# Patient Record
Sex: Female | Born: 1990 | Hispanic: Yes | Smoking: Current every day smoker
Health system: Southern US, Community
[De-identification: ages and names within clinical notes are randomized; demographics above are authoritative.]

## PROBLEM LIST (undated history)

## (undated) ENCOUNTER — Inpatient Hospital Stay (HOSPITAL_COMMUNITY): Payer: Medicaid Other | Admitting: Obstetrics & Gynecology

## (undated) DIAGNOSIS — D649 Anemia, unspecified: Secondary | ICD-10-CM

## (undated) DIAGNOSIS — Z789 Other specified health status: Secondary | ICD-10-CM

## (undated) DIAGNOSIS — A539 Syphilis, unspecified: Secondary | ICD-10-CM

## (undated) HISTORY — DX: Syphilis, unspecified: A53.9

---
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
## 2014-09-29 ENCOUNTER — Encounter (HOSPITAL_BASED_OUTPATIENT_CLINIC_OR_DEPARTMENT_OTHER): Payer: Self-pay | Admitting: Emergency Medicine

## 2014-09-29 ENCOUNTER — Emergency Department (HOSPITAL_BASED_OUTPATIENT_CLINIC_OR_DEPARTMENT_OTHER)
Admission: EM | Admit: 2014-09-29 | Discharge: 2014-09-30 | Disposition: A | Discharge status: Home / Self Care | Payer: Medicaid Other | Attending: Emergency Medicine | Admitting: Emergency Medicine

## 2014-09-29 DIAGNOSIS — A6009 Herpesviral infection of other urogenital tract: Secondary | ICD-10-CM | POA: Insufficient documentation

## 2014-09-29 DIAGNOSIS — A6000 Herpesviral infection of urogenital system, unspecified: Secondary | ICD-10-CM

## 2014-09-29 DIAGNOSIS — Z79899 Other long term (current) drug therapy: Secondary | ICD-10-CM | POA: Insufficient documentation

## 2014-09-29 DIAGNOSIS — Z3202 Encounter for pregnancy test, result negative: Secondary | ICD-10-CM | POA: Insufficient documentation

## 2014-09-29 DIAGNOSIS — K602 Anal fissure, unspecified: Secondary | ICD-10-CM

## 2014-09-29 LAB — URINALYSIS, ROUTINE W REFLEX MICROSCOPIC
Glucose, UA: NEGATIVE mg/dL
Ketones, ur: 15 mg/dL — AB
NITRITE: NEGATIVE
Protein, ur: NEGATIVE mg/dL
Specific Gravity, Urine: 1.029 (ref 1.005–1.030)
UROBILINOGEN UA: 1 mg/dL (ref 0.0–1.0)
pH: 6 (ref 5.0–8.0)

## 2014-09-29 LAB — URINE MICROSCOPIC-ADD ON

## 2014-09-29 LAB — PREGNANCY, URINE: PREG TEST UR: NEGATIVE

## 2014-09-29 MED ORDER — IBUPROFEN 800 MG PO TABS
800.0000 mg | ORAL_TABLET | Freq: Once | ORAL | Status: AC
  Administered 2014-09-29: 800 mg via ORAL
  Filled 2014-09-29: qty 1

## 2014-09-29 MED ORDER — IBUPROFEN 800 MG PO TABS: 800.0000 mg | ORAL_TABLET | Freq: Three times a day (TID) | ORAL | Status: DC

## 2014-09-29 MED ORDER — ACYCLOVIR 200 MG PO CAPS
200.0000 mg | ORAL_CAPSULE | Freq: Once | ORAL | Status: AC
  Administered 2014-09-29: 200 mg via ORAL
  Filled 2014-09-29: qty 1

## 2014-09-29 MED ORDER — ACYCLOVIR 400 MG PO TABS: 400.0000 mg | ORAL_TABLET | Freq: Every day | ORAL | Status: DC

## 2014-09-29 MED ORDER — POLYETHYLENE GLYCOL 3350 17 GM/SCOOP PO POWD: 17.0000 g | Freq: Every day | ORAL | Status: DC

## 2014-09-29 NOTE — ED Notes (Signed)
C/o rectal and vag pain x 3 days w white bumps around vag area and dc,  Rectal pain w irritation

## 2014-09-29 NOTE — Discharge Instructions (Signed)
Genital Herpes °Genital herpes is a sexually transmitted disease. This means that it is a disease passed by having sex with an infected person. There is no cure for genital herpes. The time between attacks can be months to years. The virus may live in a person but produce no problems (symptoms). This infection can be passed to a baby as it travels down the birth canal (vagina). In a newborn, this can cause central nervous system damage, eye damage, or even death. The virus that causes genital herpes is usually HSV-2 virus. The virus that causes oral herpes is usually HSV-1. The diagnosis (learning what is wrong) is made through culture results. °SYMPTOMS  °Usually symptoms of pain and itching begin a few days to a week after contact. It first appears as small blisters that progress to small painful ulcers which then scab over and heal after several days. It affects the outer genitalia, birth canal, cervix, penis, anal area, buttocks, and thighs. °HOME CARE INSTRUCTIONS  °· Keep ulcerated areas dry and clean. °· Take medications as directed. Antiviral medications can speed up healing. They will not prevent recurrences or cure this infection. These medications can also be taken for suppression if there are frequent recurrences. °· While the infection is active, it is contagious. Avoid all sexual contact during active infections. °· Condoms may help prevent spread of the herpes virus. °· Practice safe sex. °· Wash your hands thoroughly after touching the genital area. °· Avoid touching your eyes after touching your genital area. °· Inform your caregiver if you have had genital herpes and become pregnant. It is your responsibility to insure a safe outcome for your baby in this pregnancy. °· Only take over-the-counter or prescription medicines for pain, discomfort, or fever as directed by your caregiver. °SEEK MEDICAL CARE IF:  °· You have a recurrence of this infection. °· You do not respond to medications and are not  improving. °· You have new sources of pain or discharge which have changed from the original infection. °· You have an oral temperature above 102° F (38.9° C). °· You develop abdominal pain. °· You develop eye pain or signs of eye infection. °Document Released: 06/22/2000 Document Revised: 09/17/2011 Document Reviewed: 07/13/2009 °ExitCare® Patient Information ©2015 [employer]. This information is not intended to replace advice given to you by your health care provider. Make sure you discuss any questions you have with your health care provider. ° °

## 2014-09-29 NOTE — ED Provider Notes (Signed)
CSN: [id number]     Arrival date & time 09/29/14  2024 History  This chart was scribed for Azaryah Oleksy, MD by Freida Busmaniana Omoyeni, ED Scribe. This patient was seen in room MH10/MH10 and the patient's care was started 11:16 PM.     Chief Complaint  Patient presents with  . Rectal Pain     Patient is a 24 y.o. female presenting with rash. The history is provided by the patient. No language interpreter was used.  Rash Location:  Ano-genital Ano-genital rash location:  Vulva, perineum and anus Quality: painful   Pain details:    Quality:  Aching   Severity:  Moderate   Onset quality:  Gradual   Timing:  Constant   Progression:  Unchanged Onset quality:  Gradual Timing:  Constant Progression:  Unchanged Chronicity:  New Context: not plant contact   Relieved by:  Nothing Worsened by:  Nothing tried Ineffective treatments:  None tried Associated symptoms: no diarrhea, no fever, no sore throat and no throat swelling   Associated symptoms comment:  Constipation    HPI Comments:  Christina Woodard is a 24 y.o. female who presents to the Emergency Department complaining of swelling and moderate pain to her rectal and vaginal area  for 3 days. She notes pain was preceded by forceful straining while having  a bowel movement about 3 days ago. She also notes she scratched herself when wiping. Pt states pain has progressively worsened since onset and states she has noticed bumps to the area.  No associated symptoms or alleviating factors noted.  Patient gave verbal consent for family to stay during history and physical and discussion of findings.     History reviewed. No pertinent past medical history. Past Surgical History  Procedure Laterality Date  . Cesarean section    . Cesarean section     History reviewed. No pertinent family history. History  Substance Use Topics  . Smoking status: Never Smoker   . Smokeless tobacco: Not on file  . Alcohol Use: No   OB History    No data  available     Review of Systems  Constitutional: Negative for fever and chills.  HENT: Negative for sore throat.   Gastrointestinal: Positive for rectal pain. Negative for diarrhea.  Genitourinary: Positive for vaginal pain.  Skin: Positive for rash.  All other systems reviewed and are negative.     Allergies  Amoxapine and related  Home Medications   Prior to Admission medications   Medication Sig Start Date End Date Taking? Authorizing Provider  diphenhydrAMINE (BENADRYL) 25 mg capsule Take 25 mg by mouth once. Allergic reaction     Historical Provider, MD  diphenhydrAMINE (BENADRYL) 25 MG tablet Take 25 mg by mouth every 6 (six) hours as needed. For itching  02/28/11 03/30/11  Sunnie NielsenBrian Opitz, MD  Etonogestrel (IMPLANON) 68 MG IMPL Inject into the skin once.      Historical Provider, MD  ibuprofen (ADVIL,MOTRIN) 200 MG tablet Take 400 mg by mouth every 6 (six) hours as needed. pain     Historical Provider, MD  Multiple Vitamin (MULTIVITAMIN) tablet Take 1 tablet by mouth daily.      Historical Provider, MD  predniSONE (DELTASONE) 10 MG tablet Take 2 tablets (20 mg total) by mouth daily. 02/28/11 03/10/11  Sunnie NielsenBrian Opitz, MD   BP 108/64 mmHg  Pulse 80  Temp(Src) 98.6 F (37 C) (Oral)  Resp 16  Ht 5\' 4"  (1.626 m)  Wt 141 lb (63.957 kg)  BMI 24.19  kg/m2  SpO2 100% Physical Exam  Constitutional: She is oriented to person, place, and time. She appears well-developed and well-nourished. No distress.  Smells of etoh  HENT:  Head: Normocephalic and atraumatic.  Mouth/Throat: Oropharynx is clear and moist.  Eyes: Conjunctivae are normal. Pupils are equal, round, and reactive to light.  Neck: Normal range of motion. Neck supple.  Cardiovascular: Normal rate, regular rhythm and normal heart sounds.   Pulmonary/Chest: Effort normal and breath sounds normal. No respiratory distress.  Abdominal: Soft. Bowel sounds are normal. She exhibits no distension. There is no tenderness. There is no  rebound.  Genitourinary:  Rectal Exam Chaperone (scribe) was present for exam which was performed with no discomfort or complications.  Vulva and perineum and up to anus herpetic lesions  anal fissure 6 oclock position.      Musculoskeletal: Normal range of motion.  Neurological: She is alert and oriented to person, place, and time.  Skin: Skin is warm and dry.  Psychiatric: She has a normal mood and affect. Her behavior is normal.  Nursing note and vitals reviewed.   ED Course  Procedures   DIAGNOSTIC STUDIES:  Oxygen Saturation is 100% on RA, normal by my interpretation.    COORDINATION OF CARE:  11:20 PM Pt gave permission for sister to stay in room during interview and exam. Discussed treatment plan with pt at bedside and pt agreed to plan.  Labs Review Labs Reviewed  URINALYSIS, ROUTINE W REFLEX MICROSCOPIC - Abnormal; Notable for the following:    APPearance CLOUDY (*)    Hgb urine dipstick SMALL (*)    Bilirubin Urine SMALL (*)    Ketones, ur 15 (*)    Leukocytes, UA SMALL (*)    All other components within normal limits  URINE MICROSCOPIC-ADD ON - Abnormal; Notable for the following:    Squamous Epithelial / LPF FEW (*)    All other components within normal limits  PREGNANCY, URINE    Imaging Review No results found.   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  Acyclovir x 10 days and miralax for constipation and sitz baths and follow up with your GYN.  No sexual activity with active lesions  I personally performed the services described in this documentation, which was scribed in my presence. The recorded information has been reviewed and is accurate.    Cy Blamer, MD 09/30/14 [phone]

## 2014-09-29 NOTE — ED Notes (Signed)
Pt reports that 3 days ago she thought she cut herself while cleaning her self, the next day the area was swollen and she tought she had a hemorrhoid, today area is white and she has bumps on outside of her vagina, reports clear vaginal drainage,

## 2014-09-30 ENCOUNTER — Encounter (HOSPITAL_BASED_OUTPATIENT_CLINIC_OR_DEPARTMENT_OTHER): Payer: Self-pay | Admitting: Emergency Medicine

## 2014-10-01 NOTE — ED Notes (Signed)
tcf patient requesting note to return to work. Informed note could be given showing she was seen 09/29/14 from 2130 to 2350, note left at front desk for patient to pick up

## 2015-08-30 ENCOUNTER — Encounter (HOSPITAL_BASED_OUTPATIENT_CLINIC_OR_DEPARTMENT_OTHER): Payer: Self-pay | Admitting: *Deleted

## 2015-08-30 ENCOUNTER — Emergency Department (HOSPITAL_BASED_OUTPATIENT_CLINIC_OR_DEPARTMENT_OTHER)
Admission: EM | Admit: 2015-08-30 | Discharge: 2015-08-30 | Disposition: A | Discharge status: Home / Self Care | Payer: Medicaid Other | Attending: Emergency Medicine | Admitting: Emergency Medicine

## 2015-08-30 DIAGNOSIS — R55 Syncope and collapse: Secondary | ICD-10-CM | POA: Insufficient documentation

## 2015-08-30 DIAGNOSIS — R51 Headache: Secondary | ICD-10-CM | POA: Insufficient documentation

## 2015-08-30 DIAGNOSIS — Z79899 Other long term (current) drug therapy: Secondary | ICD-10-CM | POA: Insufficient documentation

## 2015-08-30 DIAGNOSIS — N939 Abnormal uterine and vaginal bleeding, unspecified: Secondary | ICD-10-CM | POA: Insufficient documentation

## 2015-08-30 DIAGNOSIS — R42 Dizziness and giddiness: Secondary | ICD-10-CM | POA: Insufficient documentation

## 2015-08-30 DIAGNOSIS — R251 Tremor, unspecified: Secondary | ICD-10-CM | POA: Insufficient documentation

## 2015-08-30 DIAGNOSIS — Z3202 Encounter for pregnancy test, result negative: Secondary | ICD-10-CM | POA: Insufficient documentation

## 2015-08-30 LAB — COMPREHENSIVE METABOLIC PANEL
ALBUMIN: 4.2 g/dL (ref 3.5–5.0)
ALK PHOS: 61 U/L (ref 38–126)
ALT: 14 U/L (ref 14–54)
AST: 18 U/L (ref 15–41)
Anion gap: 9 (ref 5–15)
BILIRUBIN TOTAL: 0.5 mg/dL (ref 0.3–1.2)
BUN: 10 mg/dL (ref 6–20)
CO2: 24 mmol/L (ref 22–32)
Calcium: 9 mg/dL (ref 8.9–10.3)
Chloride: 107 mmol/L (ref 101–111)
Creatinine, Ser: 0.8 mg/dL (ref 0.44–1.00)
GFR calc Af Amer: >60 mL/min (ref >60)
GFR calc non Af Amer: >60 mL/min (ref >60)
GLUCOSE: 87 mg/dL (ref 65–99)
POTASSIUM: 4.2 mmol/L (ref 3.5–5.1)
SODIUM: 140 mmol/L (ref 135–145)
TOTAL PROTEIN: 7 g/dL (ref 6.5–8.1)

## 2015-08-30 LAB — URINE MICROSCOPIC-ADD ON

## 2015-08-30 LAB — URINALYSIS, ROUTINE W REFLEX MICROSCOPIC
Bilirubin Urine: NEGATIVE
Glucose, UA: NEGATIVE mg/dL
KETONES UR: NEGATIVE mg/dL
LEUKOCYTES UA: NEGATIVE
Nitrite: NEGATIVE
PROTEIN: NEGATIVE mg/dL
Specific Gravity, Urine: 1.02 (ref 1.005–1.030)
pH: 6 (ref 5.0–8.0)

## 2015-08-30 LAB — CBC
HEMATOCRIT: 40.9 % (ref 36.0–46.0)
HEMOGLOBIN: 13.4 g/dL (ref 12.0–15.0)
MCH: 31.4 pg (ref 26.0–34.0)
MCHC: 32.8 g/dL (ref 30.0–36.0)
MCV: 95.8 fL (ref 78.0–100.0)
Platelets: 220 10*3/uL (ref 150–400)
RBC: 4.27 MIL/uL (ref 3.87–5.11)
RDW: 12.5 % (ref 11.5–15.5)
WBC: 4.5 10*3/uL (ref 4.0–10.5)

## 2015-08-30 LAB — PREGNANCY, URINE: PREG TEST UR: NEGATIVE

## 2015-08-30 MED ORDER — ACETAMINOPHEN 325 MG PO TABS
650.0000 mg | ORAL_TABLET | Freq: Once | ORAL | Status: AC
  Administered 2015-08-30: 650 mg via ORAL
  Filled 2015-08-30: qty 2

## 2015-08-30 NOTE — ED Provider Notes (Signed)
CSN: [id number]     Arrival date & time 08/30/15  0945 History   First MD Initiated Contact with Patient 08/30/15 [phone]     Chief Complaint  Patient presents with  . Near Syncope     (Consider location/radiation/quality/duration/timing/severity/associated sxs/prior Treatment) HPI Christina Woodard is a 25 y.o. female with no medical problems, presents to emergency department complaining of a syncopal episode. Patient states she woke up this morning feeling well, she states she was in a meeting at work, states she was standing up when suddenly she developed an urge to have a bowel movement. Shortly after that she became dizzy and next thing she remembers is waking up on the floor. Patient apparently fainted and fell forward hitting her head on the ground. Also consciousness for several seconds. Patient denies feeling confused after waking up. She denies any chest pain or palpitations. She states she feels very shaky and jittery. She states she has been under a lot of stress recently and states she has not been sleeping well. She reports just several hours of sleep last night. She did not eat breakfast, but states that is normal. She complains of a headache at this time, mild dizziness which she states is improving, and shaking of the hands. She states she is currently on her menstrual cycle which she started 2 days ago. She is not on any birth control. She is an everyday smoker. She denies any recent drug or alcohol. She denies any chest pain or abdominal pain. No history of syncope  History reviewed. No pertinent past medical history. Past Surgical History  Procedure Laterality Date  . Cesarean section    . Cesarean section     History reviewed. No pertinent family history. Social History  Substance Use Topics  . Smoking status: Never Smoker   . Smokeless tobacco: None  . Alcohol Use: No   OB History    No data available     Review of Systems  Constitutional: Positive for fatigue.  Negative for fever and chills.  Respiratory: Negative for cough, chest tightness and shortness of breath.   Cardiovascular: Negative for chest pain, palpitations and leg swelling.  Gastrointestinal: Negative for nausea, vomiting, abdominal pain and diarrhea.  Genitourinary: Positive for vaginal bleeding. Negative for dysuria, flank pain, vaginal discharge, vaginal pain and pelvic pain.  Musculoskeletal: Negative for myalgias, arthralgias, neck pain and neck stiffness.  Skin: Negative for rash.  Neurological: Positive for dizziness, tremors, syncope, light-headedness and headaches. Negative for seizures.  All other systems reviewed and are negative.     Allergies  Amoxapine and related  Home Medications   Prior to Admission medications   Medication Sig Start Date End Date Taking? Authorizing Provider  diphenhydrAMINE (BENADRYL) 25 MG tablet Take 25 mg by mouth every 6 (six) hours as needed. For itching  02/28/11 03/30/11  Sunnie Nielsen, MD  Multiple Vitamin (MULTIVITAMIN) tablet Take 1 tablet by mouth daily.      Historical Provider, MD   BP 116/84 mmHg  Pulse 88  Temp(Src) 98.1 F (36.7 C) (Oral)  Resp 16  Ht  (1.651 m)  Wt 61.236 kg  BMI 22.47 kg/m2  SpO2 100%  LMP 08/28/2015 Physical Exam  Constitutional: She is oriented to person, place, and time. She appears well-developed and well-nourished. No distress.  HENT:  Head: Normocephalic.  Eyes: Conjunctivae and EOM are normal. Pupils are equal, round, and reactive to light.  Neck: Normal range of motion. Neck supple.  Cardiovascular: Normal rate, regular rhythm  and normal heart sounds.   Pulmonary/Chest: Effort normal and breath sounds normal. No respiratory distress. She has no wheezes. She has no rales.  Abdominal: Soft. Bowel sounds are normal. She exhibits no distension. There is no tenderness. There is no rebound.  Musculoskeletal: She exhibits no edema.  Neurological: She is alert and oriented to person, place, and  time. No cranial nerve deficit.  Skin: Skin is warm and dry.  Psychiatric: She has a normal mood and affect. Her behavior is normal.  Nursing note and vitals reviewed.   ED Course  Procedures (including critical care time) Labs Review Labs Reviewed  URINALYSIS, ROUTINE W REFLEX MICROSCOPIC (NOT AT [facility]) - Abnormal; Notable for the following:    Hgb urine dipstick MODERATE (*)    All other components within normal limits  URINE MICROSCOPIC-ADD ON - Abnormal; Notable for the following:    Squamous Epithelial / LPF 0-5 (*)    Bacteria, UA FEW (*)    All other components within normal limits  PREGNANCY, URINE  CBC  COMPREHENSIVE METABOLIC PANEL    Imaging Review No results found. I have personally reviewed and evaluated these images and lab results as part of my medical decision-making.   EKG Interpretation   Date/Time:  Tuesday August 30 2015 10:29:45 EST Ventricular Rate:  59 PR Interval:  115 QRS Duration: 104 QT Interval:  409 QTC Calculation: 405 R Axis:   -43 Text Interpretation:  Sinus rhythm Borderline short PR interval Left axis  deviation No old tracing to compare Confirmed by BELFI  MD, MELANIE  ([id number]) on 08/30/2015 10:33:04 AM      MDM   Final diagnoses:  Syncope, unspecified syncope type    patient in emergency department after episode of syncope while at work. Patient was standing up and had an urge to have a BM prior to syncope. She is in NAD at this time. VS normal. PERC negative. Will get labs, ECG, fluids   11:31 AM Patient labs unremarkable. She ambulated around the department with no dizziness or any more episode of syncope or near syncope. She admits to a lot of stressors, and believes that she just "overdid it." I think the patient's symptoms may have been vagal. Will discharge home with close outpatient follow-up. Instructed to eat well-balanced meal and drink plain fluids today.  Filed Vitals:   08/30/15 0953  BP: 116/84  Pulse: 88  Temp:  98.1 F (36.7 C)  TempSrc: Oral  Resp: 16  Height:  (1.651 m)  Weight: 61.236 kg  SpO2: 100%     Jaynie Crumble, PA-C 08/30/15 1721  Rolan Bucco, MD 09/01/15 1454

## 2015-08-30 NOTE — Discharge Instructions (Signed)
Make sure to drink plenty of fluids, rest, eat well-balanced meals. Follow-up with a primary care doctor return if worsening symptoms.    Syncope Syncope is a medical term for fainting or passing out. This means you lose consciousness and drop to the ground. People are generally unconscious for less than 5 minutes. You may have some muscle twitches for up to 15 seconds before waking up and returning to normal. Syncope occurs more often in older adults, but it can happen to anyone. While most causes of syncope are not dangerous, syncope can be a sign of a serious medical problem. It is important to seek medical care.  CAUSES  Syncope is caused by a sudden drop in blood flow to the brain. The specific cause is often not determined. Factors that can bring on syncope include:  Taking medicines that lower blood pressure.  Sudden changes in posture, such as standing up quickly.  Taking more medicine than prescribed.  Standing in one place for too long.  Seizure disorders.  Dehydration and excessive exposure to heat.  Low blood sugar (hypoglycemia).  Straining to have a bowel movement.  Heart disease, irregular heartbeat, or other circulatory problems.  Fear, emotional distress, seeing blood, or severe pain. SYMPTOMS  Right before fainting, you may:  Feel dizzy or light-headed.  Feel nauseous.  See all white or all black in your field of vision.  Have cold, clammy skin. DIAGNOSIS  Your health care provider will ask about your symptoms, perform a physical exam, and perform an electrocardiogram (ECG) to record the electrical activity of your heart. Your health care provider may also perform other heart or blood tests to determine the cause of your syncope which may include:  Transthoracic echocardiogram (TTE). During echocardiography, sound waves are used to evaluate how blood flows through your heart.  Transesophageal echocardiogram (TEE).  Cardiac monitoring. This allows your  health care provider to monitor your heart rate and rhythm in real time.  Holter monitor. This is a portable device that records your heartbeat and can help diagnose heart arrhythmias. It allows your health care provider to track your heart activity for several days, if needed.  Stress tests by exercise or by giving medicine that makes the heart beat faster. TREATMENT  In most cases, no treatment is needed. Depending on the cause of your syncope, your health care provider may recommend changing or stopping some of your medicines. HOME CARE INSTRUCTIONS  Have someone stay with you until you feel stable.  Do not drive, use machinery, or play sports until your health care provider says it is okay.  Keep all follow-up appointments as directed by your health care provider.  Lie down right away if you start feeling like you might faint. Breathe deeply and steadily. Wait until all the symptoms have passed.  Drink enough fluids to keep your urine clear or pale yellow.  If you are taking blood pressure or heart medicine, get up slowly and take several minutes to sit and then stand. This can reduce dizziness. SEEK IMMEDIATE MEDICAL CARE IF:   You have a severe headache.  You have unusual pain in the chest, abdomen, or back.  You are bleeding from your mouth or rectum, or you have black or tarry stool.  You have an irregular or very fast heartbeat.  You have pain with breathing.  You have repeated fainting or seizure-like jerking during an episode.  You faint when sitting or lying down.  You have confusion.  You have trouble walking.  You have severe weakness.  You have vision problems. If you fainted, call your local emergency services (911 in [place] Do not drive yourself to the hospital.    This information is not intended to replace advice given to you by your health care provider. Make sure you discuss any questions you have with your health care provider.   Document Released:  06/25/2005 Document Revised: 11/09/2014 Document Reviewed: 08/24/2011 Elsevier Interactive Patient Education 2016 [employer].  Syncope Syncope is a medical term for fainting or passing out. This means you lose consciousness and drop to the ground. People are generally unconscious for less than 5 minutes. You may have some muscle twitches for up to 15 seconds before waking up and returning to normal. Syncope occurs more often in older adults, but it can happen to anyone. While most causes of syncope are not dangerous, syncope can be a sign of a serious medical problem. It is important to seek medical care.  CAUSES  Syncope is caused by a sudden drop in blood flow to the brain. The specific cause is often not determined. Factors that can bring on syncope include:  Taking medicines that lower blood pressure.  Sudden changes in posture, such as standing up quickly.  Taking more medicine than prescribed.  Standing in one place for too long.  Seizure disorders.  Dehydration and excessive exposure to heat.  Low blood sugar (hypoglycemia).  Straining to have a bowel movement.  Heart disease, irregular heartbeat, or other circulatory problems.  Fear, emotional distress, seeing blood, or severe pain. SYMPTOMS  Right before fainting, you may:  Feel dizzy or light-headed.  Feel nauseous.  See all white or all black in your field of vision.  Have cold, clammy skin. DIAGNOSIS  Your health care provider will ask about your symptoms, perform a physical exam, and perform an electrocardiogram (ECG) to record the electrical activity of your heart. Your health care provider may also perform other heart or blood tests to determine the cause of your syncope which may include:  Transthoracic echocardiogram (TTE). During echocardiography, sound waves are used to evaluate how blood flows through your heart.  Transesophageal echocardiogram (TEE).  Cardiac monitoring. This allows your health care  provider to monitor your heart rate and rhythm in real time.  Holter monitor. This is a portable device that records your heartbeat and can help diagnose heart arrhythmias. It allows your health care provider to track your heart activity for several days, if needed.  Stress tests by exercise or by giving medicine that makes the heart beat faster. TREATMENT  In most cases, no treatment is needed. Depending on the cause of your syncope, your health care provider may recommend changing or stopping some of your medicines. HOME CARE INSTRUCTIONS  Have someone stay with you until you feel stable.  Do not drive, use machinery, or play sports until your health care provider says it is okay.  Keep all follow-up appointments as directed by your health care provider.  Lie down right away if you start feeling like you might faint. Breathe deeply and steadily. Wait until all the symptoms have passed.  Drink enough fluids to keep your urine clear or pale yellow.  If you are taking blood pressure or heart medicine, get up slowly and take several minutes to sit and then stand. This can reduce dizziness. SEEK IMMEDIATE MEDICAL CARE IF:   You have a severe headache.  You have unusual pain in the chest, abdomen, or back.  You are bleeding  from your mouth or rectum, or you have black or tarry stool.  You have an irregular or very fast heartbeat.  You have pain with breathing.  You have repeated fainting or seizure-like jerking during an episode.  You faint when sitting or lying down.  You have confusion.  You have trouble walking.  You have severe weakness.  You have vision problems. If you fainted, call your local emergency services (911 in [place] Do not drive yourself to the hospital.    This information is not intended to replace advice given to you by your health care provider. Make sure you discuss any questions you have with your health care provider.   Document Released: 06/25/2005  Document Revised: 11/09/2014 Document Reviewed: 08/24/2011 Elsevier Interactive Patient Education [employer]

## 2015-08-30 NOTE — ED Notes (Signed)
PA at bedside.

## 2015-08-30 NOTE — ED Notes (Signed)
Pt c/o near syncopal episode this am at work, increased stress.

## 2016-06-06 ENCOUNTER — Emergency Department (HOSPITAL_BASED_OUTPATIENT_CLINIC_OR_DEPARTMENT_OTHER)
Admission: EM | Admit: 2016-06-06 | Discharge: 2016-06-06 | Disposition: A | Discharge status: Home / Self Care | Payer: Medicaid Other | Attending: Emergency Medicine | Admitting: Emergency Medicine

## 2016-06-06 ENCOUNTER — Encounter (HOSPITAL_BASED_OUTPATIENT_CLINIC_OR_DEPARTMENT_OTHER): Payer: Self-pay | Admitting: *Deleted

## 2016-06-06 DIAGNOSIS — F17210 Nicotine dependence, cigarettes, uncomplicated: Secondary | ICD-10-CM | POA: Insufficient documentation

## 2016-06-06 DIAGNOSIS — H73011 Bullous myringitis, right ear: Secondary | ICD-10-CM | POA: Insufficient documentation

## 2016-06-06 DIAGNOSIS — N898 Other specified noninflammatory disorders of vagina: Secondary | ICD-10-CM | POA: Insufficient documentation

## 2016-06-06 LAB — URINE MICROSCOPIC-ADD ON

## 2016-06-06 LAB — URINALYSIS, ROUTINE W REFLEX MICROSCOPIC
Bilirubin Urine: NEGATIVE
GLUCOSE, UA: NEGATIVE mg/dL
Ketones, ur: NEGATIVE mg/dL
LEUKOCYTES UA: NEGATIVE
NITRITE: NEGATIVE
PH: 6 (ref 5.0–8.0)
Protein, ur: NEGATIVE mg/dL
SPECIFIC GRAVITY, URINE: 1.027 (ref 1.005–1.030)

## 2016-06-06 LAB — WET PREP, GENITAL
CLUE CELLS WET PREP: NONE SEEN
SPERM: NONE SEEN
Trich, Wet Prep: NONE SEEN
Yeast Wet Prep HPF POC: NONE SEEN

## 2016-06-06 LAB — PREGNANCY, URINE: Preg Test, Ur: NEGATIVE

## 2016-06-06 MED ORDER — AMOXICILLIN-POT CLAVULANATE 875-125 MG PO TABS: 1.0000 | ORAL_TABLET | Freq: Once | ORAL | Status: DC

## 2016-06-06 MED ORDER — AZITHROMYCIN 250 MG PO TABS
500.0000 mg | ORAL_TABLET | Freq: Once | ORAL | Status: AC
  Administered 2016-06-06: 500 mg via ORAL
  Filled 2016-06-06: qty 2

## 2016-06-06 MED ORDER — AMOXICILLIN-POT CLAVULANATE 875-125 MG PO TABS: 1.0000 | ORAL_TABLET | Freq: Two times a day (BID) | ORAL | 0 refills | Status: DC

## 2016-06-06 MED ORDER — AZITHROMYCIN 250 MG PO TABS: 250.0000 mg | ORAL_TABLET | Freq: Every day | ORAL | 0 refills | Status: DC

## 2016-06-06 NOTE — ED Provider Notes (Signed)
Emergency Department Provider Note   I have reviewed the triage vital signs and the nursing notes.   HISTORY  Chief Complaint Vaginal Itching   HPI Christina Woodard is a 25 y.o. female with no significant PMH presents to the emergency department for evaluation of right ear pain and decreased hearing since yesterday. She has associated fever. The patient reports one episode of vaginal discharge which also occurred yesterday. No vaginal bleeding. No vaginal discharge noted today. For the patient's ear pain she got some over-the-counter drops and had some relief in symptoms after using these. This morning she states that she cannot hear out of her right ear. Denies associated headache, neck stiffness, or vision changes. No rashes on the face or neck. She denies discharge from the ear. The patient denies suspicion for exposure to sexual transmitted infections.    History reviewed. No pertinent past medical history.  There are no active problems to display for this patient.   Past Surgical History:  Procedure Laterality Date  . CESAREAN SECTION    . CESAREAN SECTION      Current Outpatient Rx  . Order #: [id number] Class: Print  . Order #: [id number] Class: Print  . Order #: [id number] Class: Historical Med  . Order #: [id number] Class: Historical Med    Allergies Amoxicillin and Amoxapine and related  No family history on file.  Social History Social History  Substance Use Topics  . Smoking status: Current Every Day Smoker    Packs/day: 1.00    Types: Cigarettes  . Smokeless tobacco: Not on file  . Alcohol use No    Review of Systems  Constitutional: No fever/chills Eyes: No visual changes. ENT: Positive mild sore throat. Right ear pain.  Cardiovascular: Denies chest pain. Respiratory: Denies shortness of breath. Gastrointestinal: No abdominal pain.  No nausea, no vomiting.  No diarrhea.  No constipation. Genitourinary: Negative for dysuria. Positive vaginal discharge  yesterday. Musculoskeletal: Negative for back pain. Skin: Negative for rash. Neurological: Negative for headaches, focal weakness or numbness.  10-point ROS otherwise negative.  ____________________________________________   PHYSICAL EXAM:  VITAL SIGNS: ED Triage Vitals  Enc Vitals Group     BP 06/06/16 1403 128/84     Pulse Rate 06/06/16 1403 91     Resp 06/06/16 1403 18     Temp 06/06/16 1403 98.7 F (37.1 C)     Temp Source 06/06/16 1403 Oral     SpO2 06/06/16 1403 100 %     Weight 06/06/16 1404 135 lb (61.2 kg)     Height 06/06/16 1404 5\' 4"  (1.626 m)     Pain Score 06/06/16 1405 8   Constitutional: Alert and oriented. Well appearing and in no acute distress. Eyes: Conjunctivae are normal.  Head: Atraumatic. Ears:  Mild erythema of the right auditory canal. Right TM is erythematous with fluid. Bullae on TM.  Nose: No congestion/rhinnorhea. Mouth/Throat: Mucous membranes are moist.  Oropharynx non-erythematous. Neck: No stridor.   Cardiovascular: Normal rate, regular rhythm. Good peripheral circulation. Grossly normal heart sounds.   Respiratory: Normal respiratory effort.  No retractions. Lungs CTAB. Gastrointestinal: Soft and nontender. No distention. Mild vaginal discharge. No CMT. No adnexal tenderness.  Musculoskeletal: No lower extremity tenderness nor edema. No gross deformities of extremities. Neurologic:  Normal speech and language. No gross focal neurologic deficits are appreciated.  Skin:  Skin is warm, dry and intact. No rash noted.  ____________________________________________   LABS (all labs ordered are listed, but only abnormal results are displayed)  Labs Reviewed  WET PREP, GENITAL - Abnormal; Notable for the following:       Result Value   WBC, Wet Prep HPF POC MANY (*)    All other components within normal limits  URINALYSIS, ROUTINE W REFLEX MICROSCOPIC (NOT AT [facility]) - Abnormal; Notable for the following:    Hgb urine dipstick TRACE (*)     All other components within normal limits  URINE MICROSCOPIC-ADD ON - Abnormal; Notable for the following:    Squamous Epithelial / LPF 0-5 (*)    Bacteria, UA FEW (*)    All other components within normal limits  PREGNANCY, URINE  GC/CHLAMYDIA PROBE AMP ([place]) NOT AT [facility]   ____________________________________________  RADIOLOGY  None ____________________________________________   PROCEDURES  Procedure(s) performed:   Procedures  None ____________________________________________   INITIAL IMPRESSION / ASSESSMENT AND PLAN / ED COURSE  Pertinent labs & imaging results that were available during my care of the patient were reviewed by me and considered in my medical decision making (see chart for details).  Patient resents emergency department for evaluation of right ear pain, hearing loss, vaginal discharge yesterday. She has mild to moderate erythema of her external auditory canal and tympanic membrane. She does have a believes lesion on the right TM with some bulging. No exudative material in the external auditory canal. Otherwise normal ETN exam. Urinalysis is normal. Plan to treat for bullous myringitis. We will also obtain gonorrhea and chlamydia cultures along with wet prep given vaginal discharge yesterday.   04:29 PM Patient with severe allergy to amoxicillin. Given the concern for bullous myringitis I the patient to azithromycin which will cover this organism. Discussed plan at discharge with the patient in detail.  At this time, I do not feel there is any life-threatening condition present. I have reviewed and discussed all results (EKG, imaging, lab, urine as appropriate), exam findings with patient. I have reviewed nursing notes and appropriate previous records.  I feel the patient is safe to be discharged home without further emergent workup. Discussed usual and customary return precautions. Patient and family (if present) verbalize understanding and are  comfortable with this plan.  Patient will follow-up with their primary care provider. If they do not have a primary care provider, information for follow-up has been provided to them. All questions have been answered.  ____________________________________________  FINAL CLINICAL IMPRESSION(S) / ED DIAGNOSES  Final diagnoses:  Bullous myringitis of right ear     MEDICATIONS GIVEN DURING THIS VISIT:  Medications  azithromycin (ZITHROMAX) tablet 500 mg (500 mg Oral Given 06/06/16 1643)     NEW OUTPATIENT MEDICATIONS STARTED DURING THIS VISIT:  Azithromycin    Note:  This document was prepared using Dragon voice recognition software and may include unintentional dictation errors.  Alona BeneJoshua Damilola Flamm, MD Emergency Medicine   Maia PlanJoshua G Aalyssa Elderkin, MD 06/07/16 [phone]

## 2016-06-06 NOTE — Discharge Instructions (Signed)
You were seen in the ED today with an ear infection. We are treating with antibiotics. No clear reason for your vaginal discharge at this time.   Take entire course of antibiotic and return to the ED immediately with any sudden severe headache, weakness, blurry vision, or rash.

## 2016-06-06 NOTE — ED Triage Notes (Signed)
Pt c/o vaginal discharge x 2 days also c/o right ear pain

## 2016-06-06 NOTE — ED Notes (Signed)
Right ear pain since yesterday and vag d/c today.

## 2016-06-07 LAB — GC/CHLAMYDIA PROBE AMP (~~LOC~~) NOT AT ARMC
CHLAMYDIA, DNA PROBE: NEGATIVE
Neisseria Gonorrhea: NEGATIVE

## 2017-12-05 ENCOUNTER — Other Ambulatory Visit: Payer: Self-pay

## 2017-12-05 ENCOUNTER — Emergency Department (HOSPITAL_BASED_OUTPATIENT_CLINIC_OR_DEPARTMENT_OTHER)
Admission: EM | Admit: 2017-12-05 | Discharge: 2017-12-05 | Disposition: A | Discharge status: Home / Self Care | Payer: Self-pay | Attending: Emergency Medicine | Admitting: Emergency Medicine

## 2017-12-05 ENCOUNTER — Emergency Department (HOSPITAL_BASED_OUTPATIENT_CLINIC_OR_DEPARTMENT_OTHER): Payer: Self-pay

## 2017-12-05 ENCOUNTER — Encounter (HOSPITAL_BASED_OUTPATIENT_CLINIC_OR_DEPARTMENT_OTHER): Payer: Self-pay | Admitting: Emergency Medicine

## 2017-12-05 DIAGNOSIS — Z79899 Other long term (current) drug therapy: Secondary | ICD-10-CM | POA: Insufficient documentation

## 2017-12-05 DIAGNOSIS — F17210 Nicotine dependence, cigarettes, uncomplicated: Secondary | ICD-10-CM | POA: Insufficient documentation

## 2017-12-05 DIAGNOSIS — J029 Acute pharyngitis, unspecified: Secondary | ICD-10-CM | POA: Insufficient documentation

## 2017-12-05 DIAGNOSIS — J40 Bronchitis, not specified as acute or chronic: Secondary | ICD-10-CM

## 2017-12-05 LAB — RAPID STREP SCREEN (MED CTR MEBANE ONLY): Streptococcus, Group A Screen (Direct): NEGATIVE

## 2017-12-05 MED ORDER — PREDNISONE 20 MG PO TABS: 40.0000 mg | ORAL_TABLET | Freq: Every day | ORAL | 0 refills | Status: DC

## 2017-12-05 NOTE — ED Triage Notes (Signed)
Sore throat x months

## 2017-12-05 NOTE — ED Provider Notes (Signed)
[facility] EMERGENCY DEPARTMENT Provider Note   CSN: [id number] Arrival date & time: 12/05/17  1149     History   Chief Complaint Chief Complaint  Patient presents with  . Sore Throat    HPI Christina Woodard is a 27 y.o. female.  HPI Christina Woodard is a 27 y.o. female presents to emergency department complaining of sore throat, cough, congestion for 2 months.  She states when her symptoms began she went to urgent care and got an inhaler.  She states she continues to cough with a thick productive sputum.  She reports  she had sore throat earlier but not improved.  Today she looked at her throat in the mirror and noticed some bumps which what is made her come to emergency department.  She denies any fever or chills.  She denies any shortness of breath.  She is a smoker.  No history of asthma.  She states inhaler helps when she uses it.  No other treatment at home prior to coming in.  No sick contacts.  History reviewed. No pertinent past medical history.  There are no active problems to display for this patient.   Past Surgical History:  Procedure Laterality Date  . CESAREAN SECTION    . CESAREAN SECTION       OB History   None      Home Medications    Prior to Admission medications   Medication Sig Start Date End Date Taking? Authorizing Provider  Multiple Vitamin (MULTIVITAMIN) tablet Take 1 tablet by mouth daily.     Yes [provider]  azithromycin (ZITHROMAX) 250 MG tablet Take 1 tablet (250 mg total) by mouth daily. Take first 2 tablets together, then 1 every day until finished. 06/06/16   Long, Arlyss Repress, MD  diphenhydrAMINE (BENADRYL) 25 MG tablet Take 25 mg by mouth every 6 (six) hours as needed. For itching  02/28/11 03/30/11  Sunnie Nielsen, MD    Family History No family history on file.  Social History Social History   Tobacco Use  . Smoking status: Current Every Day Smoker    Packs/day: 1.00    Types: Cigarettes  . Smokeless  tobacco: Never Used  Substance Use Topics  . Alcohol use: No  . Drug use: Yes    Types: Marijuana     Allergies   Amoxicillin and Amoxapine and related   Review of Systems Review of Systems  Constitutional: Negative for chills and fever.  HENT: Positive for congestion and sore throat.   Respiratory: Positive for cough. Negative for chest tightness and shortness of breath.   Cardiovascular: Negative for chest pain, palpitations and leg swelling.  Gastrointestinal: Negative for abdominal pain, diarrhea, nausea and vomiting.  Genitourinary: Negative for dysuria, flank pain, pelvic pain, vaginal bleeding, vaginal discharge and vaginal pain.  Musculoskeletal: Negative for arthralgias, myalgias, neck pain and neck stiffness.  Skin: Negative for rash.  Neurological: Negative for dizziness, weakness and headaches.  All other systems reviewed and are negative.    Physical Exam Updated Vital Signs BP 110/69 (BP Location: Right Arm)   Pulse 71   Temp 98.4 F (36.9 C) (Oral)   Resp 18   Ht  (1.676 m)   Wt 61.2 kg (135 lb)   SpO2 100%   BMI 21.79 kg/m   Physical Exam  Constitutional: She appears well-developed and well-nourished. No distress.  HENT:  Head: Normocephalic.  Right Ear: Tympanic membrane and ear canal normal.  Left Ear: Tympanic membrane  and ear canal normal.  Mouth/Throat: Uvula is midline and oropharynx is clear and moist. No oral lesions. No uvula swelling. No oropharyngeal exudate, posterior oropharyngeal edema, posterior oropharyngeal erythema or tonsillar abscesses.  Eyes: Conjunctivae are normal.  Neck: Neck supple.  Cardiovascular: Normal rate, regular rhythm and normal heart sounds.  Pulmonary/Chest: Effort normal and breath sounds normal. No respiratory distress. She has no wheezes. She has no rales.  Abdominal: Soft. Bowel sounds are normal. She exhibits no distension. There is no tenderness. There is no rebound.  Musculoskeletal: She exhibits no  edema.  Neurological: She is alert.  Skin: Skin is warm and dry.  Psychiatric: She has a normal mood and affect. Her behavior is normal.  Nursing note and vitals reviewed.    ED Treatments / Results  Labs (all labs ordered are listed, but only abnormal results are displayed) Labs Reviewed  RAPID STREP SCREEN ([facility] ONLY)  CULTURE, GROUP A STREP [facility])    EKG None  Radiology Dg Chest 2 View  Result Date: 12/05/2017 CLINICAL DATA:  Cough. EXAM: CHEST - 2 VIEW COMPARISON:  Radiographs of March 17, 2011. FINDINGS: The heart size and mediastinal contours are within normal limits. Both lungs are clear. No pneumothorax or pleural effusion is noted. The visualized skeletal structures are unremarkable. IMPRESSION: No active cardiopulmonary disease. Electronically Signed   By: Lupita Raider, M.D.   On: 12/05/2017 14:23    Procedures Procedures (including critical care time)  Medications Ordered in ED Medications - No data to display   Initial Impression / Assessment and Plan / ED Course  I have reviewed the triage vital signs and the nursing notes.  Pertinent labs & imaging results that were available during my care of the patient were reviewed by me and considered in my medical decision making (see chart for details).     Recent with cough, congestion, sore throat for 2 months.  Rapid strep negative.  Exam unremarkable.  Will get a chest x-ray.  Vital signs all within normal   2:42 PM Chest x-ray negative.  Most likely bronchitis.  Discussed smoking cessation.  Continue inhaler for cough and wheezing, no wheezing on exam at this time.  Prednisone for inflammation.  Follow-up with family doctor.  Return precautions discussed.  Vitals:   12/05/17 1211 12/05/17 1213  BP: 110/69   Pulse: 71   Resp: 18   Temp: 98.4 F (36.9 C)   TempSrc: Oral   SpO2: 100%   Weight:  61.2 kg (135 lb)  Height:   (1.676 m)      Final Clinical Impressions(s) / ED Diagnoses    Final diagnoses:  Bronchitis    ED Discharge Orders    None       Jaynie Crumble, PA-C 12/05/17 1447    Little, Ambrose Finland, MD 12/05/17 1547

## 2017-12-05 NOTE — ED Notes (Signed)
ED Provider at bedside. 

## 2017-12-05 NOTE — Discharge Instructions (Addendum)
Continue over the counter cold and flu medications. Take your inhaler 2 puffs every 4 hrs. Prednisone until all gone. Follow up with family doctor as needed.

## 2017-12-07 LAB — CULTURE, GROUP A STREP (THRC)

## 2019-04-29 ENCOUNTER — Emergency Department (HOSPITAL_BASED_OUTPATIENT_CLINIC_OR_DEPARTMENT_OTHER)
Admission: EM | Admit: 2019-04-29 | Discharge: 2019-04-29 | Disposition: A | Discharge status: Home / Self Care | Payer: Self-pay | Attending: Emergency Medicine | Admitting: Emergency Medicine

## 2019-04-29 ENCOUNTER — Encounter (HOSPITAL_BASED_OUTPATIENT_CLINIC_OR_DEPARTMENT_OTHER): Payer: Self-pay

## 2019-04-29 ENCOUNTER — Other Ambulatory Visit: Payer: Self-pay

## 2019-04-29 DIAGNOSIS — Z114 Encounter for screening for human immunodeficiency virus [HIV]: Secondary | ICD-10-CM | POA: Insufficient documentation

## 2019-04-29 DIAGNOSIS — Z79899 Other long term (current) drug therapy: Secondary | ICD-10-CM | POA: Insufficient documentation

## 2019-04-29 DIAGNOSIS — Z113 Encounter for screening for infections with a predominantly sexual mode of transmission: Secondary | ICD-10-CM | POA: Insufficient documentation

## 2019-04-29 DIAGNOSIS — F17210 Nicotine dependence, cigarettes, uncomplicated: Secondary | ICD-10-CM | POA: Insufficient documentation

## 2019-04-29 DIAGNOSIS — B9689 Other specified bacterial agents as the cause of diseases classified elsewhere: Secondary | ICD-10-CM

## 2019-04-29 DIAGNOSIS — N760 Acute vaginitis: Secondary | ICD-10-CM | POA: Insufficient documentation

## 2019-04-29 LAB — URINALYSIS, ROUTINE W REFLEX MICROSCOPIC
Bilirubin Urine: NEGATIVE
Glucose, UA: NEGATIVE mg/dL
Hgb urine dipstick: NEGATIVE
Ketones, ur: NEGATIVE mg/dL
Leukocytes,Ua: NEGATIVE
Nitrite: NEGATIVE
Protein, ur: NEGATIVE mg/dL
Specific Gravity, Urine: 1.025 (ref 1.005–1.030)
pH: 6 (ref 5.0–8.0)

## 2019-04-29 LAB — PREGNANCY, URINE: Preg Test, Ur: NEGATIVE

## 2019-04-29 LAB — RAPID HIV SCREEN (HIV 1/2 AB+AG)
HIV 1/2 Antibodies: NONREACTIVE
HIV-1 P24 Antigen - HIV24: NONREACTIVE

## 2019-04-29 LAB — WET PREP, GENITAL
Sperm: NONE SEEN
Trich, Wet Prep: NONE SEEN
Yeast Wet Prep HPF POC: NONE SEEN

## 2019-04-29 MED ORDER — LIDOCAINE HCL (PF) 1 % IJ SOLN
INTRAMUSCULAR | Status: AC
  Administered 2019-04-29: 1 mL
  Filled 2019-04-29: qty 5

## 2019-04-29 MED ORDER — METRONIDAZOLE 500 MG PO TABS: 500.0000 mg | ORAL_TABLET | Freq: Two times a day (BID) | ORAL | 0 refills | Status: DC

## 2019-04-29 MED ORDER — VALACYCLOVIR HCL 1 G PO TABS: 1000.0000 mg | ORAL_TABLET | Freq: Three times a day (TID) | ORAL | 0 refills | Status: DC

## 2019-04-29 MED ORDER — AZITHROMYCIN 250 MG PO TABS
1000.0000 mg | ORAL_TABLET | Freq: Once | ORAL | Status: AC
  Administered 2019-04-29: 1000 mg via ORAL
  Filled 2019-04-29: qty 4

## 2019-04-29 MED ORDER — CEFTRIAXONE SODIUM 250 MG IJ SOLR
250.0000 mg | Freq: Once | INTRAMUSCULAR | Status: AC
  Administered 2019-04-29: 250 mg via INTRAMUSCULAR
  Filled 2019-04-29: qty 250

## 2019-04-29 NOTE — ED Notes (Signed)
ED Provider at bedside. 
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
## 2019-04-30 LAB — GC/CHLAMYDIA PROBE AMP (~~LOC~~) NOT AT ARMC
Chlamydia: NEGATIVE
Neisseria Gonorrhea: NEGATIVE

## 2019-04-30 LAB — RPR
RPR Ser Ql: REACTIVE — AB
RPR Titer: 1:1 {titer}

## 2019-05-01 LAB — T.PALLIDUM AB, TOTAL: T Pallidum Abs: REACTIVE — AB

## 2019-06-09 ENCOUNTER — Emergency Department (HOSPITAL_BASED_OUTPATIENT_CLINIC_OR_DEPARTMENT_OTHER): Payer: Medicaid Other

## 2019-06-09 ENCOUNTER — Emergency Department (HOSPITAL_BASED_OUTPATIENT_CLINIC_OR_DEPARTMENT_OTHER)
Admission: EM | Admit: 2019-06-09 | Discharge: 2019-06-09 | Disposition: A | Discharge status: Home / Self Care | Payer: Medicaid Other | Attending: Emergency Medicine | Admitting: Emergency Medicine

## 2019-06-09 ENCOUNTER — Other Ambulatory Visit: Payer: Self-pay

## 2019-06-09 ENCOUNTER — Encounter (HOSPITAL_BASED_OUTPATIENT_CLINIC_OR_DEPARTMENT_OTHER): Payer: Self-pay | Admitting: Emergency Medicine

## 2019-06-09 DIAGNOSIS — Z79899 Other long term (current) drug therapy: Secondary | ICD-10-CM | POA: Insufficient documentation

## 2019-06-09 DIAGNOSIS — Z3A08 8 weeks gestation of pregnancy: Secondary | ICD-10-CM | POA: Insufficient documentation

## 2019-06-09 DIAGNOSIS — F17210 Nicotine dependence, cigarettes, uncomplicated: Secondary | ICD-10-CM | POA: Insufficient documentation

## 2019-06-09 DIAGNOSIS — O208 Other hemorrhage in early pregnancy: Secondary | ICD-10-CM | POA: Diagnosis not present

## 2019-06-09 DIAGNOSIS — R1030 Lower abdominal pain, unspecified: Secondary | ICD-10-CM | POA: Diagnosis not present

## 2019-06-09 DIAGNOSIS — N939 Abnormal uterine and vaginal bleeding, unspecified: Secondary | ICD-10-CM

## 2019-06-09 DIAGNOSIS — O99331 Smoking (tobacco) complicating pregnancy, first trimester: Secondary | ICD-10-CM | POA: Diagnosis not present

## 2019-06-09 DIAGNOSIS — O4690 Antepartum hemorrhage, unspecified, unspecified trimester: Secondary | ICD-10-CM

## 2019-06-09 LAB — URINALYSIS, ROUTINE W REFLEX MICROSCOPIC
Bilirubin Urine: NEGATIVE
Glucose, UA: NEGATIVE mg/dL
Hgb urine dipstick: NEGATIVE
Ketones, ur: NEGATIVE mg/dL
Leukocytes,Ua: NEGATIVE
Nitrite: NEGATIVE
Protein, ur: NEGATIVE mg/dL
Specific Gravity, Urine: 1.02 (ref 1.005–1.030)
pH: 7 (ref 5.0–8.0)

## 2019-06-09 LAB — CBC WITH DIFFERENTIAL/PLATELET
Abs Immature Granulocytes: 0.05 10*3/uL (ref 0.00–0.07)
Basophils Absolute: 0 10*3/uL (ref 0.0–0.1)
Basophils Relative: 1 %
Eosinophils Absolute: 0.1 10*3/uL (ref 0.0–0.5)
Eosinophils Relative: 1 %
HCT: 39.4 % (ref 36.0–46.0)
Hemoglobin: 13.4 g/dL (ref 12.0–15.0)
Immature Granulocytes: 1 %
Lymphocytes Relative: 29 %
Lymphs Abs: 1.7 10*3/uL (ref 0.7–4.0)
MCH: 31.8 pg (ref 26.0–34.0)
MCHC: 34 g/dL (ref 30.0–36.0)
MCV: 93.6 fL (ref 80.0–100.0)
Monocytes Absolute: 0.4 10*3/uL (ref 0.1–1.0)
Monocytes Relative: 7 %
Neutro Abs: 3.5 10*3/uL (ref 1.7–7.7)
Neutrophils Relative %: 61 %
Platelets: 243 10*3/uL (ref 150–400)
RBC: 4.21 MIL/uL (ref 3.87–5.11)
RDW: 11.9 % (ref 11.5–15.5)
WBC: 5.7 10*3/uL (ref 4.0–10.5)
nRBC: 0 % (ref 0.0–0.2)

## 2019-06-09 LAB — WET PREP, GENITAL
Sperm: NONE SEEN
Trich, Wet Prep: NONE SEEN
Yeast Wet Prep HPF POC: NONE SEEN

## 2019-06-09 LAB — RH IG WORKUP (INCLUDES ABO/RH)
ABO/RH(D): O NEG
Antibody Screen: NEGATIVE
Gestational Age(Wks): 12
Weak D: POSITIVE

## 2019-06-09 LAB — COMPREHENSIVE METABOLIC PANEL
ALT: 10 U/L (ref 0–44)
AST: 13 U/L — ABNORMAL LOW (ref 15–41)
Albumin: 3.7 g/dL (ref 3.5–5.0)
Alkaline Phosphatase: 43 U/L (ref 38–126)
Anion gap: 6 (ref 5–15)
BUN: 10 mg/dL (ref 6–20)
CO2: 18 mmol/L — ABNORMAL LOW (ref 22–32)
Calcium: 8.8 mg/dL — ABNORMAL LOW (ref 8.9–10.3)
Chloride: 108 mmol/L (ref 98–111)
Creatinine, Ser: 0.47 mg/dL (ref 0.44–1.00)
GFR calc Af Amer: >60 mL/min (ref >60)
GFR calc non Af Amer: >60 mL/min (ref >60)
Glucose, Bld: 96 mg/dL (ref 70–99)
Potassium: 3.9 mmol/L (ref 3.5–5.1)
Sodium: 132 mmol/L — ABNORMAL LOW (ref 135–145)
Total Bilirubin: 0.4 mg/dL (ref 0.3–1.2)
Total Protein: 6.8 g/dL (ref 6.5–8.1)

## 2019-06-09 LAB — HCG, QUANTITATIVE, PREGNANCY: hCG, Beta Chain, Quant, S: 118849 m[IU]/mL — ABNORMAL HIGH (ref <5)

## 2019-06-09 LAB — PREGNANCY, URINE: Preg Test, Ur: POSITIVE — AB

## 2019-06-09 MED ORDER — PRENATAL COMPLETE 14-0.4 MG PO TABS: 1.0000 | ORAL_TABLET | Freq: Every day | ORAL | 0 refills | Status: DC

## 2019-06-09 NOTE — Discharge Instructions (Signed)
Begin taking prenatal vitamins.  Please follow-up with the OB/GYN as planned.  I recommended that you have the RhoGam injection today, however you have decided to wait until later this week.  Please return to the emergency department or the [facility] emergency department if you develop any new or worsening symptoms.

## 2019-06-09 NOTE — ED Provider Notes (Signed)
[facility] EMERGENCY DEPARTMENT Provider Note   CSN: [id number] Arrival date & time: 06/09/19  1259     History   Chief Complaint Chief Complaint  Patient presents with  . Vaginal Bleeding    HPI Christina Woodard is a 28 y.o. G48P1 female who presents with a 1 week history of vaginal spotting and lower abdominal cramping.  Patient reports she took a pregnancy test last week and was positive.  She gets irregular menstrual cycles and is unsure of the exact last menses.  She reports may be August 2020.  She denies any urinary symptoms, nausea, vomiting, chest pain, shortness of breath, back pain, abnormal vaginal discharge.  She has not yet seen an OB/GYN.  Patient reports she has had at least one miscarriage before, as she had a positive pregnancy test and then passed a bunch of clots and what she describes as products of conception.  She reports this happened twice in the span of 3 months.  She is unsure if she had 2 miscarriages or this was the same one.     HPI  History reviewed. No pertinent past medical history.  There are no active problems to display for this patient.   Past Surgical History:  Procedure Laterality Date  . CESAREAN SECTION    . CESAREAN SECTION       OB History   No obstetric history on file.      Home Medications    Prior to Admission medications   Medication Sig Start Date End Date Taking? Authorizing Provider  azithromycin (ZITHROMAX) 250 MG tablet Take 1 tablet (250 mg total) by mouth daily. Take first 2 tablets together, then 1 every day until finished. 06/06/16   Long, Arlyss Repress, MD  diphenhydrAMINE (BENADRYL) 25 MG tablet Take 25 mg by mouth every 6 (six) hours as needed. For itching  02/28/11 03/30/11  Sunnie Nielsen, MD  metroNIDAZOLE (FLAGYL) 500 MG tablet Take 1 tablet (500 mg total) by mouth 2 (two) times daily. 04/29/19   Raeford Razor, MD  Multiple Vitamin (MULTIVITAMIN) tablet Take 1 tablet by mouth daily.      [provider]  predniSONE (DELTASONE) 20 MG tablet Take 2 tablets (40 mg total) by mouth daily. 12/05/17   Kirichenko, Lemont Fillers, PA-C  Prenatal Vit-Fe Fumarate-FA (PRENATAL COMPLETE) 14-0.4 MG TABS Take 1 tablet by mouth daily. 06/09/19   Haillie Radu, Waylan Boga, PA-C  valACYclovir (VALTREX) 1000 MG tablet Take 1 tablet (1,000 mg total) by mouth 3 (three) times daily. 04/29/19   Raeford Razor, MD    Family History No family history on file.  Social History Social History   Tobacco Use  . Smoking status: Current Every Day Smoker    Packs/day: 1.00    Types: Cigarettes  . Smokeless tobacco: Never Used  Substance Use Topics  . Alcohol use: Yes    Comment: occ  . Drug use: Yes    Types: Marijuana     Allergies   Patient has no known allergies.   Review of Systems Review of Systems  Constitutional: Negative for chills and fever.  HENT: Negative for facial swelling and sore throat.   Respiratory: Negative for shortness of breath.   Cardiovascular: Negative for chest pain.  Gastrointestinal: Positive for abdominal pain. Negative for nausea and vomiting.  Genitourinary: Positive for frequency and vaginal bleeding (spotting). Negative for dysuria and vaginal discharge.  Musculoskeletal: Negative for back pain.  Skin: Negative for rash and wound.  Neurological: Negative for headaches.  Psychiatric/Behavioral: The patient is not nervous/anxious.      Physical Exam Updated Vital Signs BP 108/71   Pulse 65   Temp 98.5 F (36.9 C) (Oral)   Resp 17   Ht 5\' 4"  (1.626 m)   Wt 68.9 kg   SpO2 100%   BMI 26.09 kg/m   Physical Exam Vitals signs and nursing note reviewed. Exam conducted with a chaperone present.  Constitutional:      General: She is not in acute distress.    Appearance: She is well-developed. She is not diaphoretic.  HENT:     Head: Normocephalic and atraumatic.     Mouth/Throat:     Pharynx: No oropharyngeal exudate.  Eyes:     General: No scleral icterus.        Right eye: No discharge.        Left eye: No discharge.     Conjunctiva/sclera: Conjunctivae normal.     Pupils: Pupils are equal, round, and reactive to light.  Neck:     Musculoskeletal: Normal range of motion and neck supple.     Thyroid: No thyromegaly.  Cardiovascular:     Rate and Rhythm: Normal rate and regular rhythm.     Heart sounds: Normal heart sounds. No murmur. No friction rub. No gallop.   Pulmonary:     Effort: Pulmonary effort is normal. No respiratory distress.     Breath sounds: Normal breath sounds. No stridor. No wheezing or rales.  Abdominal:     General: Bowel sounds are normal. There is no distension.     Palpations: Abdomen is soft.     Tenderness: There is abdominal tenderness (mild) in the suprapubic area. There is no guarding or rebound.  Genitourinary:    Vagina: Vaginal discharge (white, milky) present.     Cervix: Discharge (white/yellow) present. No cervical motion tenderness.     Uterus: Not tender.      Adnexa:        Right: No tenderness.         Left: No tenderness.    Lymphadenopathy:     Cervical: No cervical adenopathy.  Skin:    General: Skin is warm and dry.     Coloration: Skin is not pale.     Findings: No rash.  Neurological:     Mental Status: She is alert.     Coordination: Coordination normal.      ED Treatments / Results  Labs (all labs ordered are listed, but only abnormal results are displayed) Labs Reviewed  WET PREP, GENITAL - Abnormal; Notable for the following components:      Result Value   Clue Cells Wet Prep HPF POC PRESENT (*)    WBC, Wet Prep HPF POC MANY (*)    All other components within normal limits  PREGNANCY, URINE - Abnormal; Notable for the following components:   Preg Test, Ur POSITIVE (*)    All other components within normal limits  COMPREHENSIVE METABOLIC PANEL - Abnormal; Notable for the following components:   Sodium 132 (*)    CO2 18 (*)    Calcium 8.8 (*)    AST 13 (*)    All other  components within normal limits  HCG, QUANTITATIVE, PREGNANCY - Abnormal; Notable for the following components:   hCG, Beta Chain, Quant, [place] (*)    All other components within normal limits  URINALYSIS, ROUTINE W REFLEX MICROSCOPIC  CBC WITH DIFFERENTIAL/PLATELET  RH IG WORKUP (INCLUDES ABO/RH)  GC/CHLAMYDIA PROBE AMP ([facility]) NOT AT [facility]    EKG None  Radiology [place] Ob Comp < 14 Wks  Result Date: 06/09/2019 CLINICAL DATA:  Spotting, abdominal pain EXAM: OBSTETRIC <14 WK US AND TRANSVAGINAL OB US TECHNIQUE: Both transabdominal and transvaginal ultrasound examinations were performed for complete evaluation of the gestation as well as the maternal uterus, adnexal regions, and pelvic cul-de-sac. Transvaginal technique was performed to assess early pregnancy. COMPARISON:  None. FINDINGS: Intrauterine gestational sac: Single Yolk sac:  Visualized. Embryo:  Visualized. Cardiac Activity: Visualized. Heart Rate: 168 bpm CRL:  20.7 mm   8 w   5 d                  US EDC: 01/14/2020 Subchorionic hemorrhage:  Small Maternal uterus/adnexae: Question of a left fundal fibroid measuring 3.4 x 2.4 x 4.2 cm. Ovaries are unremarkable, noting corpus luteum on the right. Trace free fluid. IMPRESSION: Single viable intrauterine pregnancy as described. Small subchorionic hemorrhage. Possible left fundal fibroid. Trace free fluid. Electronically Signed   By: Guadlupe SpanishPraneil  Patel M.D.   On: 06/09/2019 15:29   [place] Ob Transvaginal  Result Date: 06/09/2019 CLINICAL DATA:  Spotting, abdominal pain EXAM: OBSTETRIC <14 WK US AND TRANSVAGINAL OB US TECHNIQUE: Both transabdominal and transvaginal ultrasound examinations were performed for complete evaluation of the gestation as well as the maternal uterus, adnexal regions, and pelvic cul-de-sac. Transvaginal technique was performed to assess early pregnancy. COMPARISON:  None. FINDINGS: Intrauterine gestational sac: Single Yolk sac:  Visualized. Embryo:  Visualized. Cardiac  Activity: Visualized. Heart Rate: 168 bpm CRL:  20.7 mm   8 w   5 d                  US EDC: 01/14/2020 Subchorionic hemorrhage:  Small Maternal uterus/adnexae: Question of a left fundal fibroid measuring 3.4 x 2.4 x 4.2 cm. Ovaries are unremarkable, noting corpus luteum on the right. Trace free fluid. IMPRESSION: Single viable intrauterine pregnancy as described. Small subchorionic hemorrhage. Possible left fundal fibroid. Trace free fluid. Electronically Signed   By: Guadlupe SpanishPraneil  Patel M.D.   On: 06/09/2019 15:29    Procedures Procedures (including critical care time)  Medications Ordered in ED Medications - No data to display   Initial Impression / Assessment and Plan / ED Course  I have reviewed the triage vital signs and the nursing notes.  Pertinent labs & imaging results that were available during my care of the patient were reviewed by me and considered in my medical decision making (see chart for details).        Patient presenting with vaginal spotting and abdominal cramping in pregnancy.  Ultrasound shows single viable intrauterine pregnancy at 8 weeks, 5 days.  There is a small subchorionic hemorrhage and possible left fundal fibroid.  Trace free fluid.  UA is negative.  HCG [id number].  Hemoglobin 13.4.  Patient is O- blood type.  RhoGam recommended, however patient does not want to stay and wait for the injection.  She has an OB/GYN appointment later this week.  I advised her of the risk to this.  Patient had no blood on my exam and reports only spotting, thus the risk is probably low.  Will defer treatment of clue cells seen on wet prep to OB/GYN.  Prenatal vitamins initiated.  Smoking cessation discussed.  Patient advised to avoid ibuprofen and only take acetaminophen for pain.  Strict return precautions given.  Patient understands and agrees with plan.  Patient vitals stable throughout ED course and discharged  in satisfactory condition.  Final Clinical Impressions(s) / ED Diagnoses    Final diagnoses:  Vaginal spotting  Vaginal bleeding in pregnancy    ED Discharge Orders         Ordered    Prenatal Vit-Fe Fumarate-FA (PRENATAL COMPLETE) 14-0.4 MG TABS  Daily     06/09/19 1730           Frederica Kuster, PA-C 06/09/19 1741    Maudie Flakes, MD 06/10/19 [phone]

## 2019-06-09 NOTE — ED Triage Notes (Signed)
C/o vaginal bleeding x 1 week with abd cramping. She had a positive pregnancy test at home but has not seen a dr.

## 2019-06-10 LAB — GC/CHLAMYDIA PROBE AMP (~~LOC~~) NOT AT ARMC
Chlamydia: NEGATIVE
Neisseria Gonorrhea: NEGATIVE

## 2019-08-25 ENCOUNTER — Encounter (HOSPITAL_BASED_OUTPATIENT_CLINIC_OR_DEPARTMENT_OTHER): Payer: Self-pay | Admitting: *Deleted

## 2019-08-25 ENCOUNTER — Emergency Department (HOSPITAL_BASED_OUTPATIENT_CLINIC_OR_DEPARTMENT_OTHER): Payer: Medicaid Other

## 2019-08-25 ENCOUNTER — Emergency Department (HOSPITAL_BASED_OUTPATIENT_CLINIC_OR_DEPARTMENT_OTHER)
Admission: EM | Admit: 2019-08-25 | Discharge: 2019-08-25 | Disposition: A | Discharge status: Home / Self Care | Payer: Medicaid Other | Attending: Emergency Medicine | Admitting: Emergency Medicine

## 2019-08-25 ENCOUNTER — Other Ambulatory Visit: Payer: Self-pay

## 2019-08-25 DIAGNOSIS — Z79899 Other long term (current) drug therapy: Secondary | ICD-10-CM | POA: Diagnosis not present

## 2019-08-25 DIAGNOSIS — O99891 Other specified diseases and conditions complicating pregnancy: Secondary | ICD-10-CM | POA: Diagnosis not present

## 2019-08-25 DIAGNOSIS — Z3A20 20 weeks gestation of pregnancy: Secondary | ICD-10-CM | POA: Diagnosis not present

## 2019-08-25 DIAGNOSIS — F17210 Nicotine dependence, cigarettes, uncomplicated: Secondary | ICD-10-CM | POA: Diagnosis not present

## 2019-08-25 DIAGNOSIS — O99332 Smoking (tobacco) complicating pregnancy, second trimester: Secondary | ICD-10-CM | POA: Insufficient documentation

## 2019-08-25 DIAGNOSIS — R519 Headache, unspecified: Secondary | ICD-10-CM | POA: Diagnosis not present

## 2019-08-25 DIAGNOSIS — B9689 Other specified bacterial agents as the cause of diseases classified elsewhere: Secondary | ICD-10-CM

## 2019-08-25 DIAGNOSIS — R1030 Lower abdominal pain, unspecified: Secondary | ICD-10-CM | POA: Diagnosis not present

## 2019-08-25 DIAGNOSIS — Z113 Encounter for screening for infections with a predominantly sexual mode of transmission: Secondary | ICD-10-CM | POA: Insufficient documentation

## 2019-08-25 DIAGNOSIS — N760 Acute vaginitis: Secondary | ICD-10-CM

## 2019-08-25 DIAGNOSIS — O23599 Infection of other part of genital tract in pregnancy, unspecified trimester: Secondary | ICD-10-CM | POA: Insufficient documentation

## 2019-08-25 DIAGNOSIS — R109 Unspecified abdominal pain: Secondary | ICD-10-CM

## 2019-08-25 LAB — WET PREP, GENITAL
Sperm: NONE SEEN
Trich, Wet Prep: NONE SEEN
Yeast Wet Prep HPF POC: NONE SEEN

## 2019-08-25 LAB — CBC WITH DIFFERENTIAL/PLATELET
Abs Immature Granulocytes: 0.09 10*3/uL — ABNORMAL HIGH (ref 0.00–0.07)
Basophils Absolute: 0 10*3/uL (ref 0.0–0.1)
Basophils Relative: 0 %
Eosinophils Absolute: 0.1 10*3/uL (ref 0.0–0.5)
Eosinophils Relative: 2 %
HCT: 37.7 % (ref 36.0–46.0)
Hemoglobin: 12.7 g/dL (ref 12.0–15.0)
Immature Granulocytes: 1 %
Lymphocytes Relative: 29 %
Lymphs Abs: 2 10*3/uL (ref 0.7–4.0)
MCH: 32.5 pg (ref 26.0–34.0)
MCHC: 33.7 g/dL (ref 30.0–36.0)
MCV: 96.4 fL (ref 80.0–100.0)
Monocytes Absolute: 0.5 10*3/uL (ref 0.1–1.0)
Monocytes Relative: 7 %
Neutro Abs: 4.2 10*3/uL (ref 1.7–7.7)
Neutrophils Relative %: 61 %
Platelets: 229 10*3/uL (ref 150–400)
RBC: 3.91 MIL/uL (ref 3.87–5.11)
RDW: 12 % (ref 11.5–15.5)
WBC: 6.9 10*3/uL (ref 4.0–10.5)
nRBC: 0 % (ref 0.0–0.2)

## 2019-08-25 LAB — URINALYSIS, MICROSCOPIC (REFLEX)

## 2019-08-25 LAB — URINALYSIS, ROUTINE W REFLEX MICROSCOPIC
Bilirubin Urine: NEGATIVE
Glucose, UA: NEGATIVE mg/dL
Hgb urine dipstick: NEGATIVE
Ketones, ur: NEGATIVE mg/dL
Nitrite: NEGATIVE
Protein, ur: NEGATIVE mg/dL
Specific Gravity, Urine: 1.015 (ref 1.005–1.030)
pH: 7.5 (ref 5.0–8.0)

## 2019-08-25 LAB — COMPREHENSIVE METABOLIC PANEL
ALT: 10 U/L (ref 0–44)
AST: 13 U/L — ABNORMAL LOW (ref 15–41)
Albumin: 3.4 g/dL — ABNORMAL LOW (ref 3.5–5.0)
Alkaline Phosphatase: 51 U/L (ref 38–126)
Anion gap: 7 (ref 5–15)
BUN: 7 mg/dL (ref 6–20)
CO2: 21 mmol/L — ABNORMAL LOW (ref 22–32)
Calcium: 8.5 mg/dL — ABNORMAL LOW (ref 8.9–10.3)
Chloride: 105 mmol/L (ref 98–111)
Creatinine, Ser: 0.52 mg/dL (ref 0.44–1.00)
GFR calc Af Amer: >60 mL/min (ref >60)
GFR calc non Af Amer: >60 mL/min (ref >60)
Glucose, Bld: 86 mg/dL (ref 70–99)
Potassium: 3.9 mmol/L (ref 3.5–5.1)
Sodium: 133 mmol/L — ABNORMAL LOW (ref 135–145)
Total Bilirubin: 0.4 mg/dL (ref 0.3–1.2)
Total Protein: 6.7 g/dL (ref 6.5–8.1)

## 2019-08-25 LAB — HCG, QUANTITATIVE, PREGNANCY: hCG, Beta Chain, Quant, S: 13534 m[IU]/mL — ABNORMAL HIGH (ref <5)

## 2019-08-25 MED ORDER — PRENATAL COMPLETE 14-0.4 MG PO TABS: ORAL_TABLET | ORAL | 4 refills | Status: DC

## 2019-08-25 MED ORDER — METRONIDAZOLE 500 MG PO TABS: 500.0000 mg | ORAL_TABLET | Freq: Two times a day (BID) | ORAL | 0 refills | Status: DC

## 2019-08-25 MED ORDER — SODIUM CHLORIDE 0.9 % IV BOLUS
1000.0000 mL | Freq: Once | INTRAVENOUS | Status: AC
  Administered 2019-08-25: 1000 mL via INTRAVENOUS

## 2019-08-25 NOTE — ED Provider Notes (Signed)
[facility] EMERGENCY DEPARTMENT Provider Note   CSN: [id number] Arrival date & time: 08/25/19  1808   History Chief Complaint  Patient presents with  . Abdominal Cramping    18 weeks preg    Christina Woodard is a 29 y.o. female G3, P1 approximately [redacted] weeks pregnant presents for evaluation of lower abdominal cramping.  Patient states she has had some cramping throughout her entire pregnancy however was worse today.  Patient also had headache.  She took Tylenol which has significantly relieved her headache and abdominal pain.  States she had vaginal bleeding last week however is not had any over the last week.  She is not currently taking prenatal vitamins.  She is not followed by OB/GYN.  Does smoke tobacco and marijuana. Denies fever, chills, N,V, CP, SOB, vaginal discharge.   History obtained from patient and past medical records. No interpretor was used.  HPI     History reviewed. No pertinent past medical history.  There are no problems to display for this patient.   Past Surgical History:  Procedure Laterality Date  . CESAREAN SECTION    . CESAREAN SECTION       OB History    Gravida  1   Para      Term      Preterm      AB      Living        SAB      TAB      Ectopic      Multiple      Live Births              History reviewed. No pertinent family history.  Social History   Tobacco Use  . Smoking status: Current Every Day Smoker    Packs/day: 1.00    Types: Cigarettes  . Smokeless tobacco: Never Used  Substance Use Topics  . Alcohol use: Yes    Comment: occ  . Drug use: Yes    Types: Marijuana    Home Medications Prior to Admission medications   Medication Sig Start Date End Date Taking? Authorizing Provider  azithromycin (ZITHROMAX) 250 MG tablet Take 1 tablet (250 mg total) by mouth daily. Take first 2 tablets together, then 1 every day until finished. 06/06/16   Long, Arlyss Repress, MD  diphenhydrAMINE (BENADRYL) 25 MG  tablet Take 25 mg by mouth every 6 (six) hours as needed. For itching  02/28/11 03/30/11  Sunnie Nielsen, MD  metroNIDAZOLE (FLAGYL) 500 MG tablet Take 1 tablet (500 mg total) by mouth 2 (two) times daily. 08/25/19   Doyle Tegethoff A, PA-C  Multiple Vitamin (MULTIVITAMIN) tablet Take 1 tablet by mouth daily.      [provider]  predniSONE (DELTASONE) 20 MG tablet Take 2 tablets (40 mg total) by mouth daily. 12/05/17   Kirichenko, Lemont Fillers, PA-C  Prenatal Vit-Fe Fumarate-FA (PRENATAL COMPLETE) 14-0.4 MG TABS Take one tablet daily 08/25/19   Refugia Laneve A, PA-C  valACYclovir (VALTREX) 1000 MG tablet Take 1 tablet (1,000 mg total) by mouth 3 (three) times daily. 04/29/19   Raeford Razor, MD    Allergies    Patient has no known allergies.  Review of Systems   Review of Systems  Constitutional: Negative.   HENT: Negative.   Respiratory: Negative.   Cardiovascular: Negative.   Gastrointestinal: Positive for abdominal pain.  Genitourinary: Negative.   Musculoskeletal: Negative.   Skin: Negative.   Neurological: Positive for headaches. Negative for dizziness, tremors, seizures, syncope,  facial asymmetry, speech difficulty, weakness, light-headedness and numbness.  All other systems reviewed and are negative.  Physical Exam Updated Vital Signs BP 126/89 (BP Location: Left Arm)   Pulse 66   Temp 98.2 F (36.8 C) (Oral)   Resp 18   Ht 5\' 5"  (1.651 m)   Wt 68 kg   LMP 03/11/2019   SpO2 100%   BMI 24.96 kg/m   Physical Exam Vitals and nursing note reviewed. Exam conducted with a chaperone present.  Constitutional:      General: She is not in acute distress.    Appearance: She is well-developed. She is not ill-appearing, toxic-appearing or diaphoretic.  HENT:     Head: Normocephalic and atraumatic.     Nose: Nose normal.     Mouth/Throat:     Mouth: Mucous membranes are moist.     Pharynx: Oropharynx is clear.  Eyes:     Pupils: Pupils are equal, round, and reactive to  light.  Cardiovascular:     Rate and Rhythm: Normal rate.     Pulses: Normal pulses.     Heart sounds: Normal heart sounds.  Pulmonary:     Effort: Pulmonary effort is normal. No respiratory distress.  Abdominal:     General: Bowel sounds are normal. There is no distension.     Palpations: Abdomen is soft.     Tenderness: There is no abdominal tenderness.     Comments: Gravid abdomen to the umbilicus.  FHT 150  Genitourinary:    Comments: Normal appearing external female genitalia without rashes or lesions, normal vaginal epithelium. Normal appearing cervix with white discharge. No cervical petechiae. Cervical os is closed. There is no bleeding noted at the os. No Odor. Bimanual: No CMT, nontender.  No palpable adnexal masses or tenderness. Uterus midline and not fixed. Rectovaginal exam was deferred.  No cystocele or rectocele noted. No pelvic lymphadenopathy noted. Wet prep was obtained.  Cultures for gonorrhea and chlamydia collected. Exam performed with chaperone in room. Musculoskeletal:        General: Normal range of motion.     Cervical back: Normal range of motion.  Skin:    General: Skin is warm and dry.  Neurological:     Mental Status: She is alert.     Comments: Cn 2-12 grossly intact. Negative finger to nose, rhomberg, heel to shin.    ED Results / Procedures / Treatments   Labs (all labs ordered are listed, but only abnormal results are displayed) Labs Reviewed  WET PREP, GENITAL - Abnormal; Notable for the following components:      Result Value   Clue Cells Wet Prep HPF POC PRESENT (*)    WBC, Wet Prep HPF POC MODERATE (*)    All other components within normal limits  HCG, QUANTITATIVE, PREGNANCY - Abnormal; Notable for the following components:   hCG, Beta Chain, Quant, S 13,534 (*)    All other components within normal limits  CBC WITH DIFFERENTIAL/PLATELET - Abnormal; Notable for the following components:   Abs Immature Granulocytes 0.09 (*)    All other  components within normal limits  COMPREHENSIVE METABOLIC PANEL - Abnormal; Notable for the following components:   Sodium 133 (*)    CO2 21 (*)    Calcium 8.5 (*)    Albumin 3.4 (*)    AST 13 (*)    All other components within normal limits  URINALYSIS, ROUTINE W REFLEX MICROSCOPIC - Abnormal; Notable for the following components:   Leukocytes,Ua MODERATE (*)  All other components within normal limits  URINALYSIS, MICROSCOPIC (REFLEX) - Abnormal; Notable for the following components:   Bacteria, UA FEW (*)    All other components within normal limits  RPR  HIV ANTIBODY (ROUTINE TESTING W REFLEX)  GC/CHLAMYDIA PROBE AMP ([place]) NOT AT [facility]    EKG None  Radiology US OB Limited > 14 wks  Result Date: 08/25/2019 CLINICAL DATA:  Lower abdominal pain for several weeks. EXAM: LIMITED OBSTETRIC ULTRASOUND FINDINGS: Number of Fetuses: 1 Heart Rate:  140 bpm Movement: Yes Presentation: Transverse lie with head to maternal left Placental Location: Anterior Previa: No Amniotic Fluid (Subjective):  Within normal limits. BPD: 4.7 cm 20 w  2 d MATERNAL FINDINGS: Cervix:  Appears closed. Uterus/Adnexae: No abnormality visualized. IMPRESSION: Single living, approximately 20 week IUP. No acute maternal findings identified. This exam is performed on an emergent basis and does not comprehensively evaluate fetal size, dating, or anatomy; follow-up complete OB US should be considered if further fetal assessment is warranted. Electronically Signed   By: Danae Orleans M.D.   On: 08/25/2019 19:57    Procedures Procedures (including critical care time)  Medications Ordered in ED Medications  sodium chloride 0.9 % bolus 1,000 mL (0 mLs Intravenous Stopped 08/25/19 2144)    ED Course  I have reviewed the triage vital signs and the nursing notes.  Pertinent labs & imaging results that were available during my care of the patient were reviewed by me and considered in my medical decision making (see  chart for details).  29 year old female G3, P1 approximately [redacted] weeks pregnant presents for evaluation of abdominal pain.  Has had some cramping throughout her entire pregnancy.  She is not followed by OB/GYN.  Vaginal bleeding last week however none currently.  Took Tylenol with significant relief of her pain.  Gravid abdomen to umbilicus.  GU exam with white discharge in vault.  Patient is not dilated or effaced.  Did have a headache this morning however resolved with Tylenol.  She has a nonfocal neuro exam without deficits.  She denies any neurologic symptoms, upper abdominal pain.  She is not hypertensive in the ED.  Labs with elevation in LFTs I low suspicion for preeclampsia.  [place] here in ED shows 20-week single IUP.  Fetal heart tones 150.  Will give patient some fluids.  Will consult OB/GYN.  Consult with Dr. Adrian Blackwater with OB/GYN.  She does not need to be placed on a monitor given she is not viable at this time. Recommends fluids, patient can be DC'd home.  He will have office contact her for close follow-up.  Patient with resolution of abdominal cramping and headache with IV fluids.  Suspect possibly dehydration related, no evidence of preterm labor or eclampsia started on Flagyl for her BV.  Discussed cessation of tobacco marijuana use.  She will be started on prenatal vitamins.  Patient to follow-up outpatient with [facility] any additional new or worsening symptoms  The patient has been appropriately medically screened and/or stabilized in the ED. I have low suspicion for any other emergent medical condition which would require further screening, evaluation or treatment in the ED or require inpatient management.  Patient is hemodynamically stable and in no acute distress.  Patient able to ambulate in department prior to ED.  Evaluation does not show acute pathology that would require ongoing or additional emergent interventions while in the emergency department or further  inpatient treatment.  I have discussed the diagnosis  with the patient and answered all questions.  Pain is been managed while in the emergency department and patient has no further complaints prior to discharge.  Patient is comfortable with plan discussed in room and is stable for discharge at this time.  I have discussed strict return precautions for returning to the emergency department.  Patient was encouraged to follow-up with PCP/specialist refer to at discharge.    MDM Rules/Calculators/A&P                       Final Clinical Impression(s) / ED Diagnoses Final diagnoses:  BV (bacterial vaginosis)  [redacted] weeks gestation of pregnancy  Abdominal cramping    Rx / DC Orders ED Discharge Orders         Ordered    Prenatal Vit-Fe Fumarate-FA (PRENATAL COMPLETE) 14-0.4 MG TABS     08/25/19 2100    metroNIDAZOLE (FLAGYL) 500 MG tablet  2 times daily     08/25/19 2146           Subhan Hoopes A, PA-C 08/25/19 2147    Wyvonnia Dusky, MD 08/25/19 2210

## 2019-08-25 NOTE — Discharge Instructions (Addendum)
Need to follow-up with OB/GYN.  They will call you to schedule an appointment or you may make an appointment with them.  Make sure to drink plenty of fluids.  If you continue to have worsening abdominal pain, vaginal bleeding.  Please seek reevaluation at the maternity admissions unit at [facility] emergency department.  They have OB/GYN's who can assess you at that time.

## 2019-08-25 NOTE — ED Notes (Signed)
Patient transported to Ultrasound 

## 2019-08-25 NOTE — ED Triage Notes (Addendum)
Pt c/o lower abd cramping and back pain , denies vaginal bleeding , pt is 18 weeks preg with no prenatal care

## 2019-08-26 LAB — HIV ANTIBODY (ROUTINE TESTING W REFLEX): HIV Screen 4th Generation wRfx: NONREACTIVE

## 2019-08-27 LAB — GC/CHLAMYDIA PROBE AMP (~~LOC~~) NOT AT ARMC
Chlamydia: NEGATIVE
Neisseria Gonorrhea: NEGATIVE

## 2019-08-27 LAB — RPR: RPR Ser Ql: NONREACTIVE

## 2019-09-11 ENCOUNTER — Ambulatory Visit (INDEPENDENT_AMBULATORY_CARE_PROVIDER_SITE_OTHER): Payer: Medicaid Other | Admitting: Family Medicine

## 2019-09-11 ENCOUNTER — Encounter: Payer: Self-pay | Admitting: Family Medicine

## 2019-09-11 ENCOUNTER — Other Ambulatory Visit: Payer: Self-pay

## 2019-09-11 ENCOUNTER — Other Ambulatory Visit (HOSPITAL_COMMUNITY)
Admission: RE | Admit: 2019-09-11 | Discharge: 2019-09-11 | Disposition: A | Discharge status: Home / Self Care | Payer: Medicaid Other | Source: Ambulatory Visit | Attending: Family Medicine | Admitting: Family Medicine

## 2019-09-11 VITALS — BP 111/77 | HR 91 | Wt 149.0 lb

## 2019-09-11 DIAGNOSIS — O34219 Maternal care for unspecified type scar from previous cesarean delivery: Secondary | ICD-10-CM | POA: Diagnosis not present

## 2019-09-11 DIAGNOSIS — Z3A22 22 weeks gestation of pregnancy: Secondary | ICD-10-CM | POA: Diagnosis not present

## 2019-09-11 DIAGNOSIS — Z98891 History of uterine scar from previous surgery: Secondary | ICD-10-CM | POA: Insufficient documentation

## 2019-09-11 DIAGNOSIS — Z3480 Encounter for supervision of other normal pregnancy, unspecified trimester: Secondary | ICD-10-CM | POA: Insufficient documentation

## 2019-09-11 DIAGNOSIS — Z8619 Personal history of other infectious and parasitic diseases: Secondary | ICD-10-CM

## 2019-09-11 NOTE — Patient Instructions (Signed)
Levonorgestrel intrauterine device (IUD) What is this medicine? LEVONORGESTREL IUD (LEE voe nor jes trel) is a contraceptive (birth control) device. The device is placed inside the uterus by a healthcare professional. It is used to prevent pregnancy. This device can also be used to treat heavy bleeding that occurs during your period. This medicine may be used for other purposes; ask your health care provider or pharmacist if you have questions. COMMON BRAND NAME(S): Kyleena, LILETTA, Mirena, Skyla What should I tell my health care provider before I take this medicine? They need to know if you have any of these conditions:  abnormal Pap smear  cancer of the breast, uterus, or cervix  diabetes  endometritis  genital or pelvic infection now or in the past  have more than one sexual partner or your partner has more than one partner  heart disease  history of an ectopic or tubal pregnancy  immune system problems  IUD in place  liver disease or tumor  problems with blood clots or take blood-thinners  seizures  use intravenous drugs  uterus of unusual shape  vaginal bleeding that has not been explained  an unusual or allergic reaction to levonorgestrel, other hormones, silicone, or polyethylene, medicines, foods, dyes, or preservatives  pregnant or trying to get pregnant  breast-feeding How should I use this medicine? This device is placed inside the uterus by a health care professional. Talk to your pediatrician regarding the use of this medicine in children. Special care may be needed. Overdosage: If you think you have taken too much of this medicine contact a poison control center or emergency room at once. NOTE: This medicine is only for you. Do not share this medicine with others. What if I miss a dose? This does not apply. Depending on the brand of device you have inserted, the device will need to be replaced every 3 to 6 years if you wish to continue using this type  of birth control. What may interact with this medicine? Do not take this medicine with any of the following medications:  amprenavir  bosentan  fosamprenavir This medicine may also interact with the following medications:  aprepitant  armodafinil  barbiturate medicines for inducing sleep or treating seizures  bexarotene  boceprevir  griseofulvin  medicines to treat seizures like carbamazepine, ethotoin, felbamate, oxcarbazepine, phenytoin, topiramate  modafinil  pioglitazone  rifabutin  rifampin  rifapentine  some medicines to treat HIV infection like atazanavir, efavirenz, indinavir, lopinavir, nelfinavir, tipranavir, ritonavir  St. John's wort  warfarin This list may not describe all possible interactions. Give your health care provider a list of all the medicines, herbs, non-prescription drugs, or dietary supplements you use. Also tell them if you smoke, drink alcohol, or use illegal drugs. Some items may interact with your medicine. What should I watch for while using this medicine? Visit your doctor or health care professional for regular check ups. See your doctor if you or your partner has sexual contact with others, becomes HIV positive, or gets a sexual transmitted disease. This product does not protect you against HIV infection (AIDS) or other sexually transmitted diseases. You can check the placement of the IUD yourself by reaching up to the top of your vagina with clean fingers to feel the threads. Do not pull on the threads. It is a good habit to check placement after each menstrual period. Call your doctor right away if you feel more of the IUD than just the threads or if you cannot feel the threads at   all. The IUD may come out by itself. You may become pregnant if the device comes out. If you notice that the IUD has come out use a backup birth control method like condoms and call your health care provider. Using tampons will not change the position of the  IUD and are okay to use during your period. This IUD can be safely scanned with magnetic resonance imaging (MRI) only under specific conditions. Before you have an MRI, tell your healthcare provider that you have an IUD in place, and which type of IUD you have in place. What side effects may I notice from receiving this medicine? Side effects that you should report to your doctor or health care professional as soon as possible:  allergic reactions like skin rash, itching or hives, swelling of the face, lips, or tongue  fever, flu-like symptoms  genital sores  high blood pressure  no menstrual period for 6 weeks during use  pain, swelling, warmth in the leg  pelvic pain or tenderness  severe or sudden headache  signs of pregnancy  stomach cramping  sudden shortness of breath  trouble with balance, talking, or walking  unusual vaginal bleeding, discharge  yellowing of the eyes or skin Side effects that usually do not require medical attention (report to your doctor or health care professional if they continue or are bothersome):  acne  breast pain  change in sex drive or performance  changes in weight  cramping, dizziness, or faintness while the device is being inserted  headache  irregular menstrual bleeding within first 3 to 6 months of use  nausea This list may not describe all possible side effects. Call your doctor for medical advice about side effects. You may report side effects to FDA at 1-800-FDA-1088. Where should I keep my medicine? This does not apply. NOTE: This sheet is a summary. It may not cover all possible information. If you have questions about this medicine, talk to your doctor, pharmacist, or health care provider.  2020 Elsevier/Gold Standard (2018-05-06 13:22:01)  

## 2019-09-11 NOTE — Progress Notes (Signed)
Subjective:  Christina Woodard is a G3P1011 [redacted]w[redacted]d being seen today for her first obstetrical visit.  Her obstetrical history is significant for previous cesarean delivery about 10 years ago due to arrest of descent. Does have h/o syphillis - was treated. Neg test after that. FOB involved. Unplanned pregnancy.. Patient does intend to breast feed. Pregnancy history fully reviewed.  Patient reports no complaints.  BP 111/77   Pulse 91   Wt 149 lb 0.6 oz (67.6 kg)   LMP 04/09/2019 (Exact Date)   BMI 24.80 kg/m   HISTORY: OB History  Gravida Para Term Preterm AB Living  3 1 1   1 1   SAB TAB Ectopic Multiple Live Births  1       1    # Outcome Date GA Lbr Len/2nd Weight Sex Delivery Anes PTL Lv  3 Current           2 SAB 2019          1 Term 2011 [redacted]w[redacted]d   M CS-LTranv EPI N LIV    No past medical history on file.  Past Surgical History:  Procedure Laterality Date  . CESAREAN SECTION    . CESAREAN SECTION      No family history on file.   Exam  BP 111/77   Pulse 91   Wt 149 lb 0.6 oz (67.6 kg)   LMP 04/09/2019 (Exact Date)   BMI 24.80 kg/m   Chaperone present during exam  CONSTITUTIONAL: Well-developed, well-nourished female in no acute distress.  HENT:  Normocephalic, atraumatic, External right and left ear normal. Oropharynx is clear and moist EYES: Conjunctivae and EOM are normal. Pupils are equal, round, and reactive to light. No scleral icterus.  NECK: Normal range of motion, supple, no masses.  Normal thyroid.  CARDIOVASCULAR: Normal heart rate noted, regular rhythm RESPIRATORY: Clear to auscultation bilaterally. Effort and breath sounds normal, no problems with respiration noted. BREASTS: Symmetric in size. No masses, skin changes, nipple drainage, or lymphadenopathy. ABDOMEN: Soft, normal bowel sounds, no distention noted.  No tenderness, rebound or guarding.  PELVIC: Normal appearing external genitalia; normal appearing vaginal mucosa and cervix. No abnormal  discharge noted. Normal uterine size, no other palpable masses, no uterine or adnexal tenderness. MUSCULOSKELETAL: Normal range of motion. No tenderness.  No cyanosis, clubbing, or edema.  2+ distal pulses. SKIN: Skin is warm and dry. No rash noted. Not diaphoretic. No erythema. No pallor. NEUROLOGIC: Alert and oriented to person, place, and time. Normal reflexes, muscle tone coordination. No cranial nerve deficit noted. PSYCHIATRIC: Normal mood and affect. Normal behavior. Normal judgment and thought content.    Assessment:    Pregnancy: G3P1011 Patient Active Problem List   Diagnosis Date Noted  . Supervision of other normal pregnancy, antepartum 09/11/2019  . History of cesarean delivery 09/11/2019  . History of syphilis 09/11/2019      Plan:   1. Supervision of other normal pregnancy, antepartum FHt and FH normal. Discussed delivery at [facility] hospital.   - Hemoglobinopathy Evaluation - Obstetric Panel, Including HIV - SMN1 COPY NUMBER ANALYSIS (SMA Carrier Screen) - Culture, OB Urine - Cystic fibrosis gene test - [place] MFM OB COMP + 14 WK; Future - Hepatitis C Antibody - [facility] AMB BABYSCRIPTS SCHEDULE OPTIMIZATION - Cytology - PAP( [place])  2. History of cesarean delivery Will get op note.  Discussed TOLAC. Patient will think about it. - Hemoglobinopathy Evaluation - Obstetric Panel, Including HIV - SMN1 COPY NUMBER ANALYSIS (SMA Carrier Screen) - Culture,  OB Urine - Cystic fibrosis gene test - [place] MFM OB COMP + 14 WK; Future - Hepatitis C Antibody - [facility] AMB BABYSCRIPTS SCHEDULE OPTIMIZATION - Cytology - PAP( [place])  3. History of syphilis RPR today.     Problem list reviewed and updated. 75% of 30 min visit spent on counseling and coordination of care.     Levie Heritage 09/11/2019

## 2019-09-14 LAB — CYTOLOGY - PAP
Chlamydia: NEGATIVE
Comment: NEGATIVE
Comment: NORMAL
Diagnosis: NEGATIVE
Neisseria Gonorrhea: NEGATIVE

## 2019-09-14 LAB — CULTURE, OB URINE

## 2019-09-14 LAB — URINE CULTURE, OB REFLEX

## 2019-09-16 ENCOUNTER — Telehealth: Payer: Self-pay

## 2019-09-16 NOTE — Telephone Encounter (Signed)
Christina Woodard, [employer] counselor called the office stating pt was exposed to syphillis and will need to be tested. Counselor made aware that pt was seen in our office on 09/11/19 but labs have not yet resulted. Understanding was voiced. [name] l [name], CMA

## 2019-09-17 ENCOUNTER — Other Ambulatory Visit (HOSPITAL_COMMUNITY): Payer: Self-pay | Admitting: *Deleted

## 2019-09-17 ENCOUNTER — Ambulatory Visit (HOSPITAL_COMMUNITY)
Admission: RE | Admit: 2019-09-17 | Discharge: 2019-09-17 | Disposition: A | Discharge status: Home / Self Care | Payer: Medicaid Other | Source: Ambulatory Visit | Attending: Obstetrics and Gynecology | Admitting: Obstetrics and Gynecology

## 2019-09-17 ENCOUNTER — Other Ambulatory Visit: Payer: Self-pay

## 2019-09-17 DIAGNOSIS — Z3A23 23 weeks gestation of pregnancy: Secondary | ICD-10-CM | POA: Diagnosis not present

## 2019-09-17 DIAGNOSIS — Z98891 History of uterine scar from previous surgery: Secondary | ICD-10-CM | POA: Diagnosis present

## 2019-09-17 DIAGNOSIS — Z363 Encounter for antenatal screening for malformations: Secondary | ICD-10-CM | POA: Diagnosis not present

## 2019-09-17 DIAGNOSIS — O34219 Maternal care for unspecified type scar from previous cesarean delivery: Secondary | ICD-10-CM | POA: Diagnosis not present

## 2019-09-17 DIAGNOSIS — Z3480 Encounter for supervision of other normal pregnancy, unspecified trimester: Secondary | ICD-10-CM | POA: Diagnosis not present

## 2019-09-17 DIAGNOSIS — Z362 Encounter for other antenatal screening follow-up: Secondary | ICD-10-CM

## 2019-09-23 LAB — OBSTETRIC PANEL, INCLUDING HIV
Antibody Screen: NEGATIVE
Basophils Absolute: 0 10*3/uL (ref 0.0–0.2)
Basos: 1 %
EOS (ABSOLUTE): 0.1 10*3/uL (ref 0.0–0.4)
Eos: 2 %
HIV Screen 4th Generation wRfx: NONREACTIVE
Hematocrit: 35.7 % (ref 34.0–46.6)
Hemoglobin: 12.4 g/dL (ref 11.1–15.9)
Hepatitis B Surface Ag: NEGATIVE
Immature Grans (Abs): 0.1 10*3/uL (ref 0.0–0.1)
Immature Granulocytes: 1 %
Lymphocytes Absolute: 1.4 10*3/uL (ref 0.7–3.1)
Lymphs: 20 %
MCH: 32.7 pg (ref 26.6–33.0)
MCHC: 34.7 g/dL (ref 31.5–35.7)
MCV: 94 fL (ref 79–97)
Monocytes Absolute: 0.5 10*3/uL (ref 0.1–0.9)
Monocytes: 7 %
Neutrophils Absolute: 4.9 10*3/uL (ref 1.4–7.0)
Neutrophils: 69 %
Platelets: 230 10*3/uL (ref 150–450)
RBC: 3.79 x10E6/uL (ref 3.77–5.28)
RDW: 12 % (ref 11.7–15.4)
RPR Ser Ql: NONREACTIVE
Rh Factor: POSITIVE
Rubella Antibodies, IGG: 8.86 index (ref >0.99)
WBC: 7.1 10*3/uL (ref 3.4–10.8)

## 2019-09-23 LAB — HEMOGLOBPATHY+FER W/A THAL RFX
Ferritin: 23 ng/mL (ref 15–150)
Hgb A2: 2.6 % (ref 1.8–3.2)
Hgb A: 97.4 % (ref 96.4–98.8)
Hgb F: 0 % (ref 0.0–2.0)
Hgb S: 0 %

## 2019-09-23 LAB — SMN1 COPY NUMBER ANALYSIS (SMA CARRIER SCREENING)

## 2019-09-23 LAB — CYSTIC FIBROSIS GENE TEST

## 2019-09-23 LAB — HEPATITIS C ANTIBODY: Hep C Virus Ab: <0.1 s/co ratio (ref 0.0–0.9)

## 2019-09-25 ENCOUNTER — Inpatient Hospital Stay (HOSPITAL_BASED_OUTPATIENT_CLINIC_OR_DEPARTMENT_OTHER)
Admission: EM | Admit: 2019-09-25 | Discharge: 2019-09-25 | Disposition: A | Discharge status: Other Acute Inpatient Hospital | Payer: Medicaid Other | Attending: Emergency Medicine | Admitting: Emergency Medicine

## 2019-09-25 ENCOUNTER — Inpatient Hospital Stay (HOSPITAL_BASED_OUTPATIENT_CLINIC_OR_DEPARTMENT_OTHER): Payer: Medicaid Other

## 2019-09-25 ENCOUNTER — Ambulatory Visit: Payer: Medicaid Other

## 2019-09-25 ENCOUNTER — Inpatient Hospital Stay (HOSPITAL_COMMUNITY): Payer: Medicaid Other

## 2019-09-25 ENCOUNTER — Other Ambulatory Visit: Payer: Self-pay

## 2019-09-25 ENCOUNTER — Encounter (HOSPITAL_BASED_OUTPATIENT_CLINIC_OR_DEPARTMENT_OTHER): Payer: Self-pay | Admitting: *Deleted

## 2019-09-25 DIAGNOSIS — S3981XA Other specified injuries of abdomen, initial encounter: Secondary | ICD-10-CM | POA: Diagnosis not present

## 2019-09-25 DIAGNOSIS — Z6790 Unspecified blood type, Rh positive: Secondary | ICD-10-CM

## 2019-09-25 DIAGNOSIS — O9A219 Injury, poisoning and certain other consequences of external causes complicating pregnancy, unspecified trimester: Secondary | ICD-10-CM

## 2019-09-25 DIAGNOSIS — Y999 Unspecified external cause status: Secondary | ICD-10-CM | POA: Diagnosis not present

## 2019-09-25 DIAGNOSIS — Z3A24 24 weeks gestation of pregnancy: Secondary | ICD-10-CM

## 2019-09-25 DIAGNOSIS — Z3689 Encounter for other specified antenatal screening: Secondary | ICD-10-CM

## 2019-09-25 DIAGNOSIS — Y9389 Activity, other specified: Secondary | ICD-10-CM | POA: Diagnosis not present

## 2019-09-25 DIAGNOSIS — W19XXXS Unspecified fall, sequela: Secondary | ICD-10-CM | POA: Diagnosis not present

## 2019-09-25 DIAGNOSIS — F17210 Nicotine dependence, cigarettes, uncomplicated: Secondary | ICD-10-CM | POA: Diagnosis not present

## 2019-09-25 DIAGNOSIS — O99332 Smoking (tobacco) complicating pregnancy, second trimester: Secondary | ICD-10-CM | POA: Diagnosis not present

## 2019-09-25 DIAGNOSIS — Z79899 Other long term (current) drug therapy: Secondary | ICD-10-CM | POA: Diagnosis not present

## 2019-09-25 DIAGNOSIS — G44319 Acute post-traumatic headache, not intractable: Secondary | ICD-10-CM | POA: Diagnosis not present

## 2019-09-25 DIAGNOSIS — O9A212 Injury, poisoning and certain other consequences of external causes complicating pregnancy, second trimester: Secondary | ICD-10-CM | POA: Diagnosis not present

## 2019-09-25 DIAGNOSIS — Y92009 Unspecified place in unspecified non-institutional (private) residence as the place of occurrence of the external cause: Secondary | ICD-10-CM

## 2019-09-25 DIAGNOSIS — W19XXXA Unspecified fall, initial encounter: Secondary | ICD-10-CM | POA: Diagnosis not present

## 2019-09-25 DIAGNOSIS — S3991XA Unspecified injury of abdomen, initial encounter: Secondary | ICD-10-CM

## 2019-09-25 DIAGNOSIS — Y9289 Other specified places as the place of occurrence of the external cause: Secondary | ICD-10-CM | POA: Insufficient documentation

## 2019-09-25 LAB — URINALYSIS, ROUTINE W REFLEX MICROSCOPIC
Bilirubin Urine: NEGATIVE
Glucose, UA: NEGATIVE mg/dL
Ketones, ur: 15 mg/dL — AB
Leukocytes,Ua: NEGATIVE
Nitrite: NEGATIVE
Protein, ur: NEGATIVE mg/dL
Specific Gravity, Urine: 1.025 (ref 1.005–1.030)
pH: 6 (ref 5.0–8.0)

## 2019-09-25 LAB — URINALYSIS, MICROSCOPIC (REFLEX): WBC, UA: NONE SEEN WBC/hpf (ref 0–5)

## 2019-09-25 MED ORDER — ACETAMINOPHEN 325 MG PO TABS
650.0000 mg | ORAL_TABLET | Freq: Once | ORAL | Status: AC
  Administered 2019-09-25: 650 mg via ORAL
  Filled 2019-09-25: qty 2

## 2019-09-25 NOTE — ED Provider Notes (Signed)
[facility] EMERGENCY DEPARTMENT Provider Note   CSN: [id number] Arrival date & time: 09/25/19  1601     History Chief Complaint  Patient presents with  . Fall  . Abdominal Pain  . [redacted] Weeks Pregnant    Christina Woodard is a 29 y.o. female.  HPI   Patient is G3, P1 at 24 weeks estimated gestational age.  Patient however she was laying on the left side of her abdomen.  Patient states she started having some pain in the left abdomen since that time..  Improved fairly shortly prior to arrival.  Patient denies any trouble with attention.  No Discharge.  She denies any other injuries  History reviewed. No pertinent past medical history.  Patient Active Problem List   Diagnosis Date Noted  . Supervision of other normal pregnancy, antepartum 09/11/2019  . History of cesarean delivery 09/11/2019  . History of syphilis 09/11/2019    Past Surgical History:  Procedure Laterality Date  . CESAREAN SECTION    . CESAREAN SECTION       OB History    Gravida  3   Para  1   Term  1   Preterm      AB  1   Living  1     SAB  1   TAB      Ectopic      Multiple      Live Births  1           No family history on file.  Social History   Tobacco Use  . Smoking status: Current Every Day Smoker    Packs/day: 1.00    Types: Cigarettes  . Smokeless tobacco: Never Used  Substance Use Topics  . Alcohol use: Yes    Comment: occ  . Drug use: Yes    Types: Marijuana    Home Medications Prior to Admission medications   Medication Sig Start Date End Date Taking? Authorizing Provider  acetaminophen (TYLENOL) 325 MG tablet Take 650 mg by mouth every 6 (six) hours as needed.    [provider]  azithromycin (ZITHROMAX) 250 MG tablet Take 1 tablet (250 mg total) by mouth daily. Take first 2 tablets together, then 1 every day until finished. Patient not taking: Reported on 09/11/2019 06/06/16   Long, Wonda Olds, MD  calcium carbonate (TUMS - DOSED IN MG  ELEMENTAL CALCIUM) 500 MG chewable tablet Chew 1 tablet by mouth daily.    [provider]  diphenhydrAMINE (BENADRYL) 25 MG tablet Take 25 mg by mouth every 6 (six) hours as needed. For itching  02/28/11 03/30/11  Teressa Lower, MD  metroNIDAZOLE (FLAGYL) 500 MG tablet Take 1 tablet (500 mg total) by mouth 2 (two) times daily. Patient not taking: Reported on 09/11/2019 08/25/19   Henderly, Britni A, PA-C  Multiple Vitamin (MULTIVITAMIN) tablet Take 1 tablet by mouth daily.      [provider]  predniSONE (DELTASONE) 20 MG tablet Take 2 tablets (40 mg total) by mouth daily. Patient not taking: Reported on 09/11/2019 12/05/17   Jeannett Senior, PA-C  Prenatal Vit-Fe Fumarate-FA (PRENATAL COMPLETE) 14-0.4 MG TABS Take one tablet daily 08/25/19   Henderly, Britni A, PA-C  valACYclovir (VALTREX) 1000 MG tablet Take 1 tablet (1,000 mg total) by mouth 3 (three) times daily. Patient not taking: Reported on 09/11/2019 04/29/19   Virgel Manifold, MD    Allergies    Patient has no known allergies.  Review of Systems   Review of  Systems  All other systems reviewed and are negative.   Physical Exam Updated Vital Signs BP 116/76 (BP Location: Right Arm)   Pulse 95   Temp 98 F (36.7 C) (Oral)   Resp 18   Ht 1.626 m (5\' 4" )   Wt 69.4 kg   LMP 04/09/2019 (Exact Date)   SpO2 100%   BMI 26.26 kg/m   Physical Exam Vitals and nursing note reviewed.  Constitutional:      General: She is not in acute distress.    Appearance: She is well-developed.  HENT:     Head: Normocephalic and atraumatic.     Right Ear: External ear normal.     Left Ear: External ear normal.  Eyes:     General: No scleral icterus.       Right eye: No discharge.        Left eye: No discharge.     Conjunctiva/sclera: Conjunctivae normal.  Neck:     Trachea: No tracheal deviation.  Cardiovascular:     Rate and Rhythm: Normal rate and regular rhythm.  Pulmonary:     Effort: Pulmonary effort is normal. No  respiratory distress.     Breath sounds: Normal breath sounds. No stridor. No wheezing or rales.  Abdominal:     General: Bowel sounds are normal. There is no distension.     Palpations: Abdomen is soft.     Tenderness: There is no abdominal tenderness. There is no guarding or rebound.     Comments: Gravid uterus, no bruising, no ttp  Genitourinary:    Comments: External genitalia exam, no vaginal bleeding, no leakage of fluid noted Musculoskeletal:        General: No tenderness.     Cervical back: Neck supple.  Skin:    General: Skin is warm and dry.     Findings: No rash.  Neurological:     Mental Status: She is alert.     Cranial Nerves: No cranial nerve deficit (no facial droop, extraocular movements intact, no slurred speech).     Sensory: No sensory deficit.     Motor: No abnormal muscle tone or seizure activity.     Coordination: Coordination normal.     ED Results / Procedures / Treatments   Labs (all labs ordered are listed, but only abnormal results are displayed) Labs Reviewed  URINALYSIS, ROUTINE W REFLEX MICROSCOPIC - Abnormal; Notable for the following components:      Result Value   Hgb urine dipstick TRACE (*)    Ketones, ur 15 (*)    All other components within normal limits  URINALYSIS, MICROSCOPIC (REFLEX) - Abnormal; Notable for the following components:   Bacteria, UA RARE (*)    All other components within normal limits    EKG None  Radiology No results found.  Procedures Procedures (including critical care time)  Medications Ordered in ED Medications - No data to display  ED Course  I have reviewed the triage vital signs and the nursing notes.  Pertinent labs & imaging results that were available during my care of the patient were reviewed by me and considered in my medical decision making (see chart for details).  Clinical Course as of Sep 24 1653  Fri Sep 25, 2019  1630 Fetal heart tones present at bedside   [JK]    Clinical Course  User Index [JK] Sep 27, 2019, MD   MDM Rules/Calculators/A&P  Pt presented after a fall, EGA [redacted] weeks.   Pt is medically stable.  DIscussed with rapid response team.  Dr Marice Potter consulted.  Plan is for transfer to [facility] for further fetal monitoring and ultrasound.  Pt is stable to transfer via POV. Final Clinical Impression(s) / ED Diagnoses Final diagnoses:  Pregnancy with abdominal wall injury, antepartum    Rx / DC Orders ED Discharge Orders    None       Linwood Dibbles, MD 09/25/19 1655

## 2019-09-25 NOTE — Discharge Instructions (Addendum)
Proceed to [facility] hospital maternity admissions unit [facility]).  The OB doctor would like to have you monitored and perform an ultrasound.     Abdominal Pain During Pregnancy  Abdominal pain is common during pregnancy, and has many possible causes. Some causes are more serious than others, and sometimes the cause is not known. Abdominal pain can be a sign that labor is starting. It can also be caused by normal growth and stretching of muscles and ligaments during pregnancy. Always tell your health care provider if you have any abdominal pain. Follow these instructions at home:  Do not have sex or put anything in your vagina until your pain goes away completely.  Get plenty of rest until your pain improves.  Drink enough fluid to keep your urine pale yellow.  Take over-the-counter and prescription medicines only as told by your health care provider.  Keep all follow-up visits as told by your health care provider. This is important. Contact a health care provider if:  Your pain continues or gets worse after resting.  You have lower abdominal pain that: ? Comes and goes at regular intervals. ? Spreads to your back. ? Is similar to menstrual cramps.  You have pain or burning when you urinate. Get help right away if:  You have a fever or chills.  You have vaginal bleeding.  You are leaking fluid from your vagina.  You are passing tissue from your vagina.  You have vomiting or diarrhea that lasts for more than 24 hours.  Your baby is moving less than usual.  You feel very weak or faint.  You have shortness of breath.  You develop severe pain in your upper abdomen. Summary  Abdominal pain is common during pregnancy, and has many possible causes.  If you experience abdominal pain during pregnancy, tell your health care provider right away.  Follow your health care provider's home care instructions and keep all follow-up visits as directed. This  information is not intended to replace advice given to you by your health care provider. Make sure you discuss any questions you have with your health care provider. Document Revised: 10/13/2018 Document Reviewed: 09/27/2016 Elsevier Patient Education  2020 [employer].    Placental Abruption  The placenta is the organ formed during pregnancy. It carries oxygen and nutrients to the unborn baby (fetus). The placenta is the baby's life support system. In normal circumstances, it remains attached to the inside of the uterus until the baby is born. Placental abruption is a condition in which the placenta partly or completely separates from the uterus before the baby is born. Placental abruption is rare, but it can happen any time after 20 weeks of pregnancy. A small separation may not cause problems, but a large separation may be dangerous for you and your baby. A large separation is usually an emergency that requires treatment right away. What are the causes? In most cases, the cause of this condition is not known. What increases the risk? This condition is more likely to develop in women who:  Have experienced a recent trauma such as a fall, an injury to the abdomen, or a car accident.  Have had a previous placental abruption.  Have high blood pressure.  Smoke cigarettes, use alcohol, or use drugs, such as cocaine.  Have multiples (twins, triplets, or more).  Have too much amniotic fluid (polyhydramnios).  Are 70 years of age or older. What are the signs or symptoms? Symptoms of this condition can  be mild or severe. A small placental abruption may not cause symptoms, or it may cause mild symptoms, which may include:  Mild pain in the abdomen or lower back.  Slight bleeding in the vagina. A severe placental abruption will cause symptoms. The symptoms will depend on the size of the separation and the stage of pregnancy. They may include:  Severe pain in the abdomen or lower  back.  Bleeding from the vagina.  Ongoing contractions of your uterus with little or no relaxation of your uterus between contractions. How is this diagnosed? There are no tests to diagnose placental abruption. However, your health care provider may suspect that you have this condition based on:  Your symptoms.  A physical exam.  Ultrasound findings.  Blood tests. How is this treated? Treatment for placental abruption depends on the severity of the condition.  Mild cases may be monitored through close observation. You may be admitted to the hospital during this time.  Severe cases may require emergency treatment. This may involve: ? Admission to the hospital. ? Emergency cesarean delivery of your baby. ? A blood transfusion or other fluids given through an IV. Follow these instructions at home: Activity  Get plenty of rest and sleep.  Do not have sex until your health care provider says it is okay. Lifestyle  Do not use drugs.  Do not drink alcohol.  Do not use tampons or douche unless your health care provider says it is okay.  Do not use any products that contain nicotine or tobacco, such as cigarettes, e-cigarettes, and chewing tobacco. If you need help quitting, ask your health care provider. General instructions  Take over-the-counter and prescription medicines only as told by your health care provider.  Do not take any medicines that your health care provider has not approved.  Arrange for help at home so you can get adequate rest.  Keep all follow-up visits as told by your health care provider. This is important. Contact a health care provider if:  You have vaginal spotting.  You are having mild, regular contractions. Get help right away if:  You have moderate or heavy vaginal bleeding or spotting.  You have severe pain in the abdomen.  You have continuous uterine contractions.  You have a hard, tender uterus.  You have any type of trauma, such as  an injury to the abdomen, a fall, or a car accident.  You do not feel the baby move, or the baby moves very little. Summary  Placental abruption is a condition in which the placenta partly or completely separates from the uterus before the baby is born.  Placental abruption is a medical emergency that requires treatment right away.  Contact a health care provider if you have vaginal bleeding, or if you have mild, regular contractions.  Get help right away if you have heavy vaginal bleeding, severe pain in the abdomen, continuous uterine contractions, or you do not feel the baby move.  Keep all follow-up visits as told by your health care provider. This is important. This information is not intended to replace advice given to you by your health care provider. Make sure you discuss any questions you have with your health care provider. Document Revised: 12/18/2018 Document Reviewed: 12/18/2018 Elsevier Patient Education  2020 [employer].  Preterm Labor and Birth Information  The normal length of a pregnancy is 39-41 weeks. Preterm labor is when labor starts before 37 completed weeks of pregnancy. What are the risk factors for preterm labor? Preterm  labor is more likely to occur in women who:  Have certain infections during pregnancy such as a bladder infection, sexually transmitted infection, or infection inside the uterus (chorioamnionitis).  Have a shorter-than-normal cervix.  Have gone into preterm labor before.  Have had surgery on their cervix.  Are younger than age 93 or older than age 41.  Are African American.  Are pregnant with twins or multiple babies (multiple gestation).  Take street drugs or smoke while pregnant.  Do not gain enough weight while pregnant.  Became pregnant shortly after having been pregnant. What are the symptoms of preterm labor? Symptoms of preterm labor include:  Cramps similar to those that can happen during a menstrual period. The  cramps may happen with diarrhea.  Pain in the abdomen or lower back.  Regular uterine contractions that may feel like tightening of the abdomen.  A feeling of increased pressure in the pelvis.  Increased watery or bloody mucus discharge from the vagina.  Water breaking (ruptured amniotic sac). Why is it important to recognize signs of preterm labor? It is important to recognize signs of preterm labor because babies who are born prematurely may not be fully developed. This can put them at an increased risk for:  Long-term (chronic) heart and lung problems.  Difficulty immediately after birth with regulating body systems, including blood sugar, body temperature, heart rate, and breathing rate.  Bleeding in the brain.  Cerebral palsy.  Learning difficulties.  Death. These risks are highest for babies who are born before 34 weeks of pregnancy. How is preterm labor treated? Treatment depends on the length of your pregnancy, your condition, and the health of your baby. It may involve:  Having a stitch (suture) placed in your cervix to prevent your cervix from opening too early (cerclage).  Taking or being given medicines, such as: ? Hormone medicines. These may be given early in pregnancy to help support the pregnancy. ? Medicine to stop contractions. ? Medicines to help mature the baby's lungs. These may be prescribed if the risk of delivery is high. ? Medicines to prevent your baby from developing cerebral palsy. If the labor happens before 34 weeks of pregnancy, you may need to stay in the hospital. What should I do if I think I am in preterm labor? If you think that you are going into preterm labor, call your health care provider right away. How can I prevent preterm labor in future pregnancies? To increase your chance of having a full-term pregnancy:  Do not use any tobacco products, such as cigarettes, chewing tobacco, and e-cigarettes. If you need help quitting, ask your  health care provider.  Do not use street drugs or medicines that have not been prescribed to you during your pregnancy.  Talk with your health care provider before taking any herbal supplements, even if you have been taking them regularly.  Make sure you gain a healthy amount of weight during your pregnancy.  Watch for infection. If you think that you might have an infection, get it checked right away.  Make sure to tell your health care provider if you have gone into preterm labor before. This information is not intended to replace advice given to you by your health care provider. Make sure you discuss any questions you have with your health care provider. Document Revised: 10/17/2018 Document Reviewed: 11/16/2015 Elsevier Patient Education  2020 [employer].  Safe Medications in Pregnancy    Acne: Benzoyl Peroxide Salicylic Acid  Backache/Headache: Tylenol: 2 regular strength every 4 hours OR              2 Extra strength every 6 hours  Colds/Coughs/Allergies: Benadryl (alcohol free) 25 mg every 6 hours as needed Breath right strips Claritin Cepacol throat lozenges Chloraseptic throat spray Cold-Eeze- up to three times per day Cough drops, alcohol free Flonase (by prescription only) Guaifenesin Mucinex Robitussin DM (plain only, alcohol free) Saline nasal spray/drops Sudafed (pseudoephedrine) & Actifed ** use only after [redacted] weeks gestation and if you do not have high blood pressure Tylenol Vicks Vaporub Zinc lozenges Zyrtec   Constipation: Colace Ducolax suppositories Fleet enema Glycerin suppositories Metamucil Milk of magnesia Miralax Senokot Smooth move tea  Diarrhea: Kaopectate Imodium A-D  *NO pepto Bismol  Hemorrhoids: Anusol Anusol HC Preparation H Tucks  Indigestion: Tums Maalox Mylanta Zantac  Pepcid  Insomnia: Benadryl (alcohol free) 25mg  every 6 hours as needed Tylenol PM Unisom, no Gelcaps  Leg  Cramps: Tums MagGel  Nausea/Vomiting:  Bonine Dramamine Emetrol Ginger extract Sea bands Meclizine  Nausea medication to take during pregnancy:  Unisom (doxylamine succinate 25 mg tablets) Take one tablet daily at bedtime. If symptoms are not adequately controlled, the dose can be increased to a maximum recommended dose of two tablets daily (1/2 tablet in the morning, 1/2 tablet mid-afternoon and one at bedtime). Vitamin B6 100mg  tablets. Take one tablet twice a day (up to 200 mg per day).  Skin Rashes: Aveeno products Benadryl cream or 25mg  every 6 hours as needed Calamine Lotion 1% cortisone cream  Yeast infection: Gyne-lotrimin 7 Monistat 7   **If taking multiple medications, please check labels to avoid duplicating the same active ingredients **take medication as directed on the label ** Do not exceed 4000 mg of tylenol in 24 hours **Do not take medications that contain aspirin or ibuprofen

## 2019-09-25 NOTE — ED Triage Notes (Signed)
She is [redacted] weeks pregnant. She fell in the shower. She is having pain in her left lateral abdomen. The baby is not active since the fall. She is unsure if she had leakage of fluid at the time of the fall due to being in the shower.

## 2019-09-25 NOTE — Progress Notes (Addendum)
1623 Received call regarding this 28yo G3P1 @ 24.[redacted] wks GA in with report of fall on left side when getting out of shower.  Reports left rib and side pain per ED RN. Denies abdominal pain, vaginal bleeding, or LOF from vagina and reports fetal movement. 1634 FHR appropriate for GA, no UC's noted. 1637 Dr. Marice Potter notified of above. Orders to notify her when patient is medically cleared. Pt will need to go from [facility] to [facility] MAU for further fetal monitoring and OB US.

## 2019-09-25 NOTE — MAU Note (Signed)
.   Christina Woodard is a 29 y.o. at [redacted]w[redacted]d here in MAU reporting:she was sent here for 4 hours of monitoring from [facility] after a fall earlier today  Onset of complaint: this morning Pain score: 8 Vitals:   09/25/19 1655 09/25/19 1827  BP: 115/71 118/70  Pulse: 98 91  Resp: 17 16  Temp:  98.1 F (36.7 C)  SpO2: 100% 99%     FHT:145 Lab orders placed from triage:

## 2019-09-25 NOTE — ED Notes (Signed)
Per Denny Peon, rapid response nurse;  She relates that she spoke to Dr. Marice Potter and that patient needs to go to MAU for observation for 4 hours and get an OB ultrasound while there; once cleared medically.  Dr. Lynelle Doctor notified and will send pt to MAU.

## 2019-09-25 NOTE — MAU Provider Note (Addendum)
History     CSN: [id number]  Arrival date and time: 09/25/19 1601   First Provider Initiated Contact with Patient 09/25/19 1909      Chief Complaint  Patient presents with  . Fall  . Abdominal Pain  . [redacted] Weeks Pregnant   HPI Christina Woodard 29 y.o. [redacted]w[redacted]d  Comes to MAU today from [employer]. Previous notes and labs reviewed.  Consult with attending here earlier today and plan was to come for monitoring and OB US.  Was seen there due to a fall in her bathroom today.  Client reports she slipped getting out of the shower and hit her left side near her ribs and in the upper and waist line of her left side.  She also reports she hit the left side of her head and has a headache. Patient reports she told the person in triage at [facility] that she hit her head. Is having pain in the left side of her abdomen.  Did not feel the baby move well after the fall but the baby is moving well now.  Denies any vaginal bleeding or leaking of fluid.    OB History    Gravida  3   Para  1   Term  1   Preterm      AB  1   Living  1     SAB  1   TAB      Ectopic      Multiple      Live Births  1           History reviewed. No pertinent past medical history.  Past Surgical History:  Procedure Laterality Date  . CESAREAN SECTION    . CESAREAN SECTION      History reviewed. No pertinent family history.  Social History   Tobacco Use  . Smoking status: Current Every Day Smoker    Packs/day: 1.00    Types: Cigarettes  . Smokeless tobacco: Never Used  Substance Use Topics  . Alcohol use: Yes    Comment: occ  . Drug use: Yes    Types: Marijuana    Allergies: No Known Allergies  Medications Prior to Admission  Medication Sig Dispense Refill Last Dose  . acetaminophen (TYLENOL) 325 MG tablet Take 650 mg by mouth every 6 (six) hours as needed.     Marland Kitchen azithromycin (ZITHROMAX) 250 MG tablet Take 1 tablet (250 mg total) by mouth daily. Take first 2 tablets together, then 1  every day until finished. (Patient not taking: Reported on 09/11/2019) 6 tablet 0   . calcium carbonate (TUMS - DOSED IN MG ELEMENTAL CALCIUM) 500 MG chewable tablet Chew 1 tablet by mouth daily.     . diphenhydrAMINE (BENADRYL) 25 MG tablet Take 25 mg by mouth every 6 (six) hours as needed. For itching      . metroNIDAZOLE (FLAGYL) 500 MG tablet Take 1 tablet (500 mg total) by mouth 2 (two) times daily. (Patient not taking: Reported on 09/11/2019) 14 tablet 0   . Multiple Vitamin (MULTIVITAMIN) tablet Take 1 tablet by mouth daily.       . predniSONE (DELTASONE) 20 MG tablet Take 2 tablets (40 mg total) by mouth daily. (Patient not taking: Reported on 09/11/2019) 10 tablet 0   . Prenatal Vit-Fe Fumarate-FA (PRENATAL COMPLETE) 14-0.4 MG TABS Take one tablet daily 60 tablet 4   . valACYclovir (VALTREX) 1000 MG tablet Take 1 tablet (1,000 mg total) by mouth 3 (three) times daily. (  Patient not taking: Reported on 09/11/2019) 21 tablet 0     Review of Systems  Constitutional: Negative for fever.  Respiratory: Negative for cough and shortness of breath.   Gastrointestinal: Positive for abdominal pain. Negative for nausea and vomiting.  Genitourinary: Negative for dysuria, vaginal bleeding and vaginal discharge.  Neurological: Positive for headaches.   Physical Exam   Blood pressure 118/70, pulse 91, temperature 98.1 F (36.7 C), resp. rate 16, height  (1.626 m), weight 69.4 kg, last menstrual period 04/09/2019, SpO2 99 %.  Patient Vitals for the past 24 hrs:  BP Temp Temp src Pulse Resp SpO2 Height Weight  09/25/19 1827 118/70 98.1 F (36.7 C) -- 91 16 99 % -- --  09/25/19 1655 115/71 -- -- 98 17 100 % -- --  09/25/19 1621 116/76 -- -- 95 18 100 % -- --  09/25/19 1608 124/85 98 F (36.7 C) Oral 97 20 100 % -- --  09/25/19 1607 -- -- -- -- -- --  (1.626 m) 69.4 kg   Physical Exam  Nursing note and vitals reviewed. Constitutional: She is oriented to person, place, and time. She appears  well-developed and well-nourished.  HENT:  Head: Normocephalic.  Eyes: EOM are normal.  GI: Soft. There is abdominal tenderness. There is no rebound and no guarding.  Tender on left side over ribs and just below ribs extending to level of the umbilicus on the left side.  Musculoskeletal:        General: Normal range of motion.     Cervical back: Neck supple.  Neurological: She is alert and oriented to person, place, and time.  Skin: Skin is warm and dry.  Psychiatric: She has a normal mood and affect.   Results for orders placed or performed during the hospital encounter of 09/25/19 (from the past 24 hour(s))  Urinalysis, Routine w reflex microscopic     Status: Abnormal   Collection Time: 09/25/19  4:16 PM  Result Value Ref Range   Color, Urine YELLOW YELLOW   APPearance CLEAR CLEAR   Specific Gravity, Urine 1.025 1.005 - 1.030   pH 6.0 5.0 - 8.0   Glucose, UA NEGATIVE NEGATIVE mg/dL   Hgb urine dipstick TRACE (A) NEGATIVE   Bilirubin Urine NEGATIVE NEGATIVE   Ketones, ur 15 (A) NEGATIVE mg/dL   Protein, ur NEGATIVE NEGATIVE mg/dL   Nitrite NEGATIVE NEGATIVE   Leukocytes,Ua NEGATIVE NEGATIVE  Urinalysis, Microscopic (reflex)     Status: Abnormal   Collection Time: 09/25/19  4:16 PM  Result Value Ref Range   RBC / HPF 0-5 0 - 5 RBC/hpf   WBC, UA NONE SEEN 0 - 5 WBC/hpf   Bacteria, UA RARE (A) NONE SEEN   Squamous Epithelial / LPF 0-5 0 - 5   CT HEAD WO CONTRAST  Result Date: 09/25/2019 CLINICAL DATA:  Headache EXAM: CT HEAD WITHOUT CONTRAST TECHNIQUE: Contiguous axial images were obtained from the base of the skull through the vertex without intravenous contrast. COMPARISON:  None. FINDINGS: Brain: No acute intracranial abnormality. Specifically, no hemorrhage, hydrocephalus, mass lesion, acute infarction, or significant intracranial injury. Vascular: No hyperdense vessel or unexpected calcification. Skull: No acute calvarial abnormality. Sinuses/Orbits: Visualized paranasal  sinuses and mastoids clear. Orbital soft tissues unremarkable. Other: None IMPRESSION: Normal study. Electronically Signed   By: Charlett Nose M.D.   On: 09/25/2019 21:38   [place] MFM OB COMP + 14 WK  Result Date: 09/17/2019 ----------------------------------------------------------------------  OBSTETRICS REPORT                       (  Signed Final 09/17/2019 09:45 am) ---------------------------------------------------------------------- Patient Info  ID #:       [id number]                          D.O.B.:  08/14/1990 (28 yrs)  Name:       Christina Woodard               Visit Date: 09/17/2019 08:50 am ---------------------------------------------------------------------- Performed By  Performed By:     Percell Boston          Ref. Address:     [address]                    [address]                                                             [address]  Attending:        Ma Rings MD         [facility]  Referred By:      [facility] [place] ---------------------------------------------------------------------- Orders   #  Description                          Code         Ordered By   1  [place] MFM OB COMP + 14 WK               78469.62     Candelaria Celeste  ----------------------------------------------------------------------   #  Order #                    Accession #                 Episode #   1  [id number]                  [id number]                  [id number]  ---------------------------------------------------------------------- Indications   Encounter for antenatal screening for          Z36.3   malformations (no testing in chart)   [redacted] weeks gestation of pregnancy                Z3A.23   Previous cesarean delivery, antepartum x1      O34.219  ---------------------------------------------------------------------- Fetal Evaluation  Num Of Fetuses:         1  Fetal Heart Rate(bpm):  137  Cardiac Activity:       Observed   Presentation:           Breech  Placenta:               Anterior  P. Cord Insertion:      Visualized, central  Amniotic Fluid  AFI  FV:      Within normal limits                              Largest Pocket(cm)                              4.18 ---------------------------------------------------------------------- Biometry  BPD:      54.1  mm     G. Age:  22w 3d         25  %    CI:         70.8   %    70 - 86                                                          FL/HC:      18.3   %    19.2 - 20.8  HC:      204.9  mm     G. Age:  22w 4d         21  %    HC/AC:      1.14        1.05 - 1.21  AC:      180.5  mm     G. Age:  22w 6d         38  %    FL/BPD:     69.1   %    71 - 87  FL:       37.4  mm     G. Age:  21w 6d         11  %    FL/AC:      20.7   %    20 - 24  HUM:      33.7  mm     G. Age:  21w 3d         11  %  CER:      25.1  mm     G. Age:  23w 1d         52  %  CM:        4.8  mm  Est. FW:     508  gm      1 lb 2 oz     20  % ---------------------------------------------------------------------- OB History  Gravidity:    3         Term:   1        Prem:   0        SAB:   1  TOP:          0       Ectopic:  0        Living: 1 ---------------------------------------------------------------------- Gestational Age  LMP:           23w 0d        Date:  04/09/19                 EDD:   01/14/20  U/S Today:     22w 3d  EDD:   01/18/20  Best:          23w 0d     Det. By:  LMP  (04/09/19)          EDD:   01/14/20 ---------------------------------------------------------------------- Anatomy  Cranium:               Appears normal         Aortic Arch:            Appears normal  Cavum:                 Appears normal         Ductal Arch:            Appears normal  Ventricles:            Appears normal         Diaphragm:              Appears normal  Choroid Plexus:        Appears normal         Stomach:                Appears normal, left                                                                         sided  Cerebellum:            Appears normal         Abdomen:                Appears normal  Posterior Fossa:       Appears normal         Abdominal Wall:         Appears nml (cord                                                                        insert, abd wall)  Nuchal Fold:           Not applicable (>20    Cord Vessels:           Appears normal ([redacted]                         wks GA)                                        vessel cord)  Face:                  Orbits nl; profile not Kidneys:                Appear normal                         well visualized  Lips:  Appears normal         Bladder:                Appears normal  Thoracic:              Appears normal         Spine:                  Not well visualized  Heart:                 Not well visualized    Upper Extremities:      Appears normal  RVOT:                  Appears normal         Lower Extremities:      Appears normal  LVOT:                  Appears normal  Other:  Heels visualized. Technically difficult due to fetal position. ---------------------------------------------------------------------- Cervix Uterus Adnexa  Cervix  Length:            3.1  cm.  Normal appearance by transabdominal scan.  Uterus  No abnormality visualized.  Left Ovary  No adnexal mass visualized.  Right Ovary  No adnexal mass visualized.  Cul De Sac  No free fluid seen.  Adnexa  No abnormality visualized. ---------------------------------------------------------------------- Comments  This patient was seen for a detailed fetal anatomy scan. She  denies any significant past medical history and denies any  problems in her current pregnancy.  The patient has not had any screening tests for fetal  aneuploidy drawn in her current pregnancy.  She was informed that the fetal growth and amniotic fluid  level were appropriate for her gestational age.  There were no obvious fetal anomalies noted on today's  ultrasound exam. The views of the  fetal anatomy were limited  today due to the fetal position.  The patient was informed that anomalies may be missed due  to technical limitations. If the fetus is in a suboptimal position  or maternal habitus is increased, visualization of the fetus in  the maternal uterus may be impaired.  The patient should have a screening test for fetal aneuploidy  drawn at her next prenatal visit should she desire.  A follow-up exam was scheduled in 4 weeks to complete the  views of the fetal anatomy. ----------------------------------------------------------------------                   Ma Rings, MD Electronically Signed Final Report   09/17/2019 09:45 am ----------------------------------------------------------------------  [place] MFM OB LIMITED  Result Date: 09/25/2019 ----------------------------------------------------------------------  OBSTETRICS REPORT                       (Signed Final 09/25/2019 10:09 pm) ---------------------------------------------------------------------- Patient Info  ID #:       [id number]                          D.O.B.:  28-Jul-1990 (28 yrs)  Name:       Christina Woodard               Visit Date: 09/25/2019 08:45 pm ---------------------------------------------------------------------- Performed By  Performed By:     Darlyn Read Phy.:    [facility] [place]  RDMS  Attending:        Ma Rings MD         Address:           [address]                                                              [address]  Referred By:      [facility] MAU/Triage         [facility] ---------------------------------------------------------------------- Orders   #  Description                          Code         Ordered By   1  [place] MFM OB LIMITED                    91694.50     Nolene Bernheim  ----------------------------------------------------------------------   #  Order #                     Accession #                 Episode #   1  [id number]                  [id number]                  [id number]  ---------------------------------------------------------------------- Indications   Traumatic injury during pregnancy FALL TO      O9A.219 T14.90   LT CHEST   [redacted] weeks gestation of pregnancy                Z3A.24   Previous cesarean delivery, antepartum x1      O34.219  ---------------------------------------------------------------------- Fetal Evaluation  Num Of Fetuses:          1  Fetal Heart Rate(bpm):   135  Cardiac Activity:        Observed  Presentation:            Cephalic  Placenta:                Anterior  Amniotic Fluid  AFI FV:      Within normal limits                              Largest Pocket(cm)                              5.79  Comment:    No placental abruption  identified. ---------------------------------------------------------------------- OB History  Gravidity:    3         Term:   1        Prem:   0  SAB:   1  TOP:          0       Ectopic:  0        Living: 1 ---------------------------------------------------------------------- Gestational Age  LMP:           24w 1d        Date:  04/09/19                 EDD:   01/14/20  Best:          24w 1d     Det. By:  LMP  (04/09/19)          EDD:   01/14/20 ---------------------------------------------------------------------- Cervix Uterus Adnexa  Cervix  Length:           3.52  cm.  Normal appearance by transabdominal scan.  Contraction at LUS  Left Ovary  Not visualized.  Right Ovary  Within normal limits. ---------------------------------------------------------------------- Comments  A limited ultrasound performed today shows that the fetus is  in the vertex presentation.  There was normal amniotic fluid noted. ----------------------------------------------------------------------                   Ma Rings, MD Electronically Signed Final Report   09/25/2019 10:09 pm  ----------------------------------------------------------------------  MAU Course  Procedures  MDM Ice pack to rib area due to pain with palpation.  Tylenol PO for headache.  Monitoring began at 6:25 PM.   Consult with Dr. Marice Potter - since client reported head injury during her fall today and now has a headache, will get head CT.  Care assumed by N. Aubriee Szeto, NP at 2100  EFM (hour 1): appropriate for gestation age       -baseline: 140       -variability: moderate       -accels: present, 10x10       -decels: absent       -TOCO: none -tracing appropriate x4hrs -RH positive -UA @[facility] : trace hgb/15ketones/rare bacteria -after Tylenol, pt reports HA now resolved - : CL 3.52, no abruption identified, normal amniotic fluid -CT Head: normal -pt continues to deny vaginal bleeding, abdominal pain, vaginal discharge -ice pack resolved rib pain -consulted with Dr. [place] @ 1057PM, per Dr. Marice Potter, pt OK to be discharged home -pt discharged to home in stable condition   Orders Placed This Encounter  Procedures  . Culture, OB Urine    Standing Status:   Standing    Number of Occurrences:   1  . Marice Potter MFM OB LIMITED    Standing Status:   Standing    Number of Occurrences:   1    Order Specific Question:   Symptom/Reason for Exam    Answer:   Fall at home, sequela [place]  . CT HEAD WO CONTRAST    Standing Status:   Standing    Number of Occurrences:   1    Order Specific Question:   Release to patient    Answer:   Immediate    Order Specific Question:   Radiology Contrast Protocol - do NOT remove file path    Answer:   [email or web address]  . Urinalysis, Routine w reflex microscopic    Standing Status:   Standing    Number of Occurrences:   1  . Urinalysis, Microscopic (reflex)    Standing Status:   Standing    Number of Occurrences:   1  . Fetal monitoring    Standing Status:   Standing    Number of Occurrences:  1  . Discharge patient    Order Specific  Question:   Discharge disposition    Answer:   01-Home or Self Care [1]    Order Specific Question:   Discharge patient date    Answer:   09/25/2019   Meds ordered this encounter  Medications  . acetaminophen (TYLENOL) tablet 650 mg   Assessment and Plan   1. Traumatic injury during pregnancy in second trimester   2. Pregnancy with abdominal wall injury, antepartum   3. Fall at home, sequela   4. [redacted] weeks gestation of pregnancy   5. NST (non-stress test) reactive   6. Acute post-traumatic headache, not intractable   7. Blood type, Rh positive    Allergies as of 09/25/2019   No Known Allergies     Medication List    TAKE these medications   acetaminophen 325 MG tablet Commonly known as: TYLENOL Take 650 mg by mouth every 6 (six) hours as needed.   azithromycin 250 MG tablet Commonly known as: ZITHROMAX Take 1 tablet (250 mg total) by mouth daily. Take first 2 tablets together, then 1 every day until finished.   calcium carbonate 500 MG chewable tablet Commonly known as: TUMS - dosed in mg elemental calcium Chew 1 tablet by mouth daily.   diphenhydrAMINE 25 MG tablet Commonly known as: BENADRYL Take 25 mg by mouth every 6 (six) hours as needed. For itching   metroNIDAZOLE 500 MG tablet Commonly known as: FLAGYL Take 1 tablet (500 mg total) by mouth 2 (two) times daily.   multivitamin tablet Take 1 tablet by mouth daily.   predniSONE 20 MG tablet Commonly known as: DELTASONE Take 2 tablets (40 mg total) by mouth daily.   Prenatal Complete 14-0.4 MG Tabs Take one tablet daily   valACYclovir 1000 MG tablet Commonly known as: VALTREX Take 1 tablet (1,000 mg total) by mouth 3 (three) times daily.      -discussed s/sx of placental abruption, when to return to MAU -pt discharged to home in stable condition  Joni Reining E Elgar Scoggins 09/25/2019, 11:00 PM

## 2019-09-28 ENCOUNTER — Other Ambulatory Visit: Payer: Self-pay

## 2019-09-28 ENCOUNTER — Ambulatory Visit (INDEPENDENT_AMBULATORY_CARE_PROVIDER_SITE_OTHER): Payer: Medicaid Other

## 2019-09-28 VITALS — BP 109/67 | HR 93 | Wt 152.0 lb

## 2019-09-28 DIAGNOSIS — Z3A25 25 weeks gestation of pregnancy: Secondary | ICD-10-CM | POA: Diagnosis not present

## 2019-09-28 DIAGNOSIS — O98112 Syphilis complicating pregnancy, second trimester: Secondary | ICD-10-CM | POA: Diagnosis not present

## 2019-09-28 DIAGNOSIS — Z8619 Personal history of other infectious and parasitic diseases: Secondary | ICD-10-CM

## 2019-09-28 MED ORDER — PENICILLIN G BENZATHINE 1200000 UNIT/2ML IM SUSP
2.4000 10*6.[IU] | Freq: Once | INTRAMUSCULAR | Status: AC
  Administered 2019-09-28: 16:00:00 2.4 10*6.[IU] via INTRAMUSCULAR

## 2019-09-28 NOTE — Progress Notes (Signed)
Chart reviewed - agree with CMA/RN documentation.  ° °

## 2019-09-28 NOTE — Progress Notes (Signed)
Patient states she has developed a sore in the vaginal area and would like for it to be looked at. Scheduled patient to return to see Dr. Adrian Blackwater this week. Armandina Stammer RN

## 2019-09-28 NOTE — Progress Notes (Signed)
Patient presents for injection of penicillin benzathine 2.109mu for RPR exposure per Dr. Adrian Blackwater. Patient has spot on vaginal area she would like provider to evaluate- patient given appointment to return.  Armandina Stammer RN

## 2019-10-01 ENCOUNTER — Other Ambulatory Visit: Payer: Self-pay

## 2019-10-01 ENCOUNTER — Ambulatory Visit (INDEPENDENT_AMBULATORY_CARE_PROVIDER_SITE_OTHER): Payer: Medicaid Other | Admitting: Family Medicine

## 2019-10-01 VITALS — BP 116/61 | HR 95 | Wt 151.0 lb

## 2019-10-01 DIAGNOSIS — O34219 Maternal care for unspecified type scar from previous cesarean delivery: Secondary | ICD-10-CM

## 2019-10-01 DIAGNOSIS — O99891 Other specified diseases and conditions complicating pregnancy: Secondary | ICD-10-CM

## 2019-10-01 DIAGNOSIS — Z3480 Encounter for supervision of other normal pregnancy, unspecified trimester: Secondary | ICD-10-CM

## 2019-10-01 DIAGNOSIS — Z3A25 25 weeks gestation of pregnancy: Secondary | ICD-10-CM

## 2019-10-01 DIAGNOSIS — Z8619 Personal history of other infectious and parasitic diseases: Secondary | ICD-10-CM

## 2019-10-01 DIAGNOSIS — Z98891 History of uterine scar from previous surgery: Secondary | ICD-10-CM

## 2019-10-01 DIAGNOSIS — N766 Ulceration of vulva: Secondary | ICD-10-CM

## 2019-10-01 MED ORDER — NICOTINE 14 MG/24HR TD PT24: 14.0000 mg | MEDICATED_PATCH | Freq: Every day | TRANSDERMAL | 1 refills | Status: DC

## 2019-10-01 MED ORDER — AZITHROMYCIN 500 MG PO TABS: 1000.0000 mg | ORAL_TABLET | Freq: Once | ORAL | 1 refills | Status: AC

## 2019-10-01 NOTE — Progress Notes (Signed)
   PRENATAL VISIT NOTE  Subjective:  Christina Woodard is a 29 y.o. G3P1011 at [redacted]w[redacted]d being seen today for ongoing prenatal care.  She is currently monitored for the following issues for this low-risk pregnancy and has Supervision of other normal pregnancy, antepartum; History of cesarean delivery; and History of syphilis on their problem list.  Patient reports painful gential ulcer, was there last week when received PCN for syphilis exposure. Feels like it is improving..  Contractions: Not present. Vag. Bleeding: None.  Movement: Present. Denies leaking of fluid.   The following portions of the patient's history were reviewed and updated as appropriate: allergies, current medications, past family history, past medical history, past social history, past surgical history and problem list.   Objective:   Vitals:   10/01/19 1023  BP: 116/61  Pulse: 95  Weight: 151 lb (68.5 kg)    Fetal Status: Fetal Heart Rate (bpm): 132   Movement: Present     General:  Alert, oriented and cooperative. Patient is in no acute distress.  Skin: Skin is warm and dry. No rash noted.   Cardiovascular: Normal heart rate noted  Respiratory: Normal respiratory effort, no problems with respiration noted  Abdomen: Soft, gravid, appropriate for gestational age.  Pain/Pressure: Present     Pelvic: Cervical exam deferred      29mm right lower labial painful ulcer.  Extremities: Normal range of motion.  Edema: Trace  Mental Status: Normal mood and affect. Normal behavior. Normal judgment and thought content.   Assessment and Plan:  Pregnancy: G3P1011 at [redacted]w[redacted]d 1. Supervision of other normal pregnancy, antepartum FHT and FH normal  2. History of cesarean delivery  3. History of syphilis Received PCN G 2.47mill units.  RPR negative  4. Genital labial ulcer Painful ulcer. Usually syphilis is painless ulcer.  ? Chancroid vs HSV. Looks larger than typical HSV (no cluster of ulcers). Will give Azithromycin 1g in  addition. HSV culture obtained. Pt to call if not improving in next several days. - Herpes simplex virus culture  Preterm labor symptoms and general obstetric precautions including but not limited to vaginal bleeding, contractions, leaking of fluid and fetal movement were reviewed in detail with the patient. Please refer to After Visit Summary for other counseling recommendations.   No follow-ups on file.  Future Appointments  Date Time Provider Department Center  10/15/2019  9:00 AM [facility] [place] 3 [facility]  10/22/2019  8:15 AM Levie Heritage, DO [facility] None    Levie Heritage, DO

## 2019-10-06 LAB — HERPES SIMPLEX VIRUS CULTURE

## 2019-10-06 LAB — SPECIMEN STATUS REPORT

## 2019-10-11 ENCOUNTER — Other Ambulatory Visit: Payer: Self-pay

## 2019-10-11 ENCOUNTER — Encounter (HOSPITAL_COMMUNITY): Payer: Self-pay | Admitting: Family Medicine

## 2019-10-11 ENCOUNTER — Inpatient Hospital Stay (HOSPITAL_COMMUNITY)
Admission: AD | Admit: 2019-10-11 | Discharge: 2019-10-11 | Disposition: A | Discharge status: Home / Self Care | Payer: Medicaid Other | Attending: Family Medicine | Admitting: Family Medicine

## 2019-10-11 DIAGNOSIS — O26893 Other specified pregnancy related conditions, third trimester: Secondary | ICD-10-CM | POA: Diagnosis not present

## 2019-10-11 DIAGNOSIS — S0990XA Unspecified injury of head, initial encounter: Secondary | ICD-10-CM | POA: Diagnosis not present

## 2019-10-11 DIAGNOSIS — R109 Unspecified abdominal pain: Secondary | ICD-10-CM | POA: Diagnosis present

## 2019-10-11 DIAGNOSIS — Z3A26 26 weeks gestation of pregnancy: Secondary | ICD-10-CM | POA: Diagnosis not present

## 2019-10-11 DIAGNOSIS — F17210 Nicotine dependence, cigarettes, uncomplicated: Secondary | ICD-10-CM | POA: Insufficient documentation

## 2019-10-11 DIAGNOSIS — O26892 Other specified pregnancy related conditions, second trimester: Secondary | ICD-10-CM

## 2019-10-11 DIAGNOSIS — O99332 Smoking (tobacco) complicating pregnancy, second trimester: Secondary | ICD-10-CM | POA: Insufficient documentation

## 2019-10-11 DIAGNOSIS — O9A212 Injury, poisoning and certain other consequences of external causes complicating pregnancy, second trimester: Secondary | ICD-10-CM | POA: Insufficient documentation

## 2019-10-11 LAB — URINALYSIS, ROUTINE W REFLEX MICROSCOPIC
Bilirubin Urine: NEGATIVE
Glucose, UA: NEGATIVE mg/dL
Hgb urine dipstick: NEGATIVE
Ketones, ur: NEGATIVE mg/dL
Leukocytes,Ua: NEGATIVE
Nitrite: NEGATIVE
Protein, ur: NEGATIVE mg/dL
Specific Gravity, Urine: 1.021 (ref 1.005–1.030)
pH: 6 (ref 5.0–8.0)
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
## 2019-10-11 NOTE — Discharge Instructions (Signed)
Head Injury, Adult There are many types of head injuries. They can be as minor as a bump. Some head injuries can be worse. Worse injuries include:  A strong hit to the head that shakes the brain back and forth causing damage (concussion).  A bruise (contusion) of the brain. This means there is bleeding in the brain that can cause swelling.  A cracked skull (skull fracture).  Bleeding in the brain that gathers, gets thick (makes a clot), and forms a bump (hematoma). Most problems from a head injury come in the first 24 hours. However, you may still have side effects up to 7-10 days after your injury. It is important to watch your condition for any changes. You may need to be watched in the emergency department or urgent care, or you may need to stay in the hospital. What are the causes? There are many possible causes of a head injury. A serious head injury may be caused by:  A car accident.  Bicycle or motorcycle accidents.  Sports injuries.  Falls. What are the signs or symptoms? Symptoms of a head injury include a bruise, bump, or bleeding where the injury happened. Other physical symptoms may include:  Headache.  Feeling sick to your stomach (nauseous) or vomiting.  Dizziness.  Feeling tired.  Being uncomfortable around bright lights or loud noises.  Shaking movements that you cannot control (seizures).  Trouble being woken up.  Passing out (fainting). Mental or emotional symptoms may include:  Feeling grumpy or cranky.  Confusion and memory problems.  Having trouble paying attention or concentrating.  Changes in eating or sleeping habits.  Feeling worried or nervous (anxious).  Feeling sad (depressed). How is this treated? Treatment for this condition depends on how severe the injury is and the type of injury you have. The main goal is to prevent complications and to allow the brain time to heal. Mild head injury If you have a mild head injury, you may be  sent home and treatment may include:  Being watched. A responsible adult should stay with you for 24 hours after your injury and check on you often.  Physical rest.  Brain rest.  Pain medicines. Severe head injury If you have a severe head injury, treatment may include:  Being watched closely. This includes hospitalization with frequent physical exams.  Medicines to: ? Help with pain. ? Prevent shaking movements that you cannot control. ? Help with brain swelling.  Using a machine that helps you breathe (ventilator).  Treatments to manage the swelling inside the brain.  Brain surgery. This may be needed to: ? Remove a blood clot. ? Stop the bleeding. ? Remove a part of the skull. This allows room for the brain to swell. Follow these instructions at home: Activity  Rest.  Avoid activities that are hard or tiring.  Make sure you get enough sleep.  Limit activities that need a lot of thought or attention, such as: ? Watching TV. ? Playing memory games and puzzles. ? Job-related work or homework. ? Working on the computer, social media, and texting.  Avoid activities that could cause another head injury until your doctor says it is okay. This includes playing sports. Having another head injury, especially before the first one has healed, can be dangerous.  Ask your doctor when it is safe for you to go back to your normal activities, such as work or school. Ask your doctor for a step-by-step plan for slowly going back to your normal activities.  Ask   your doctor when you can drive, ride a bicycle, or use heavy machinery. Do not do these activities if you are dizzy. Lifestyle   Do not drink alcohol until your doctor says it is okay.  Do not use drugs.  If it is harder than usual to remember things, write them down.  If you are easily distracted, try to do one thing at a time.  Talk with family members or close friends when making important decisions.  Tell your  friends, family, a trusted coworker, and work manager about your injury, symptoms, and limits (restrictions). Have them watch for any problems that are new or getting worse. General instructions  Take over-the-counter and prescription medicines only as told by your doctor.  Have someone stay with you for 24 hours after your head injury. This person should watch you for any changes in your symptoms and be ready to get help.  Keep all follow-up visits as told by your doctor. This is important. How is this prevented?  Work on your balance and strength. This can help you avoid falls.  Wear a seatbelt when you are in a moving vehicle.  Wear a helmet when you: ? Ride a bicycle. ? Ski. ? Do any other sport or activity that has a risk of injury.  If you drink alcohol: ? Limit how much you use to:  0-1 drink a day for women.  0-2 drinks a day for men. ? Be aware of how much alcohol is in your drink. In the [place] one drink equals one 12 oz bottle of beer (355 mL), one 5 oz glass of wine (148 mL), or one 1 oz glass of hard liquor (44 mL).  Make your home safer by: ? Getting rid of clutter from the floors and stairs. This includes things that can make you trip. ? Using grab bars in bathrooms and handrails by stairs. ? Placing non-slip mats on floors and in bathtubs. ? Putting more light in dim areas. Get help right away if:  You have: ? A very bad headache that is not helped by medicine. ? Trouble walking or weakness in your arms and legs. ? Clear or bloody fluid coming from your nose or ears. ? Changes in how you see (vision). ? Shaking movements that you cannot control.  You lose your balance.  You vomit.  The black centers of your eyes (pupils) change in size.  Your speech is slurred.  Your dizziness gets worse.  You pass out.  You are sleepier than normal and have trouble staying awake.  Your symptoms get worse. These symptoms may be an emergency. Do not wait to see  if the symptoms will go away. Get medical help right away. Call your local emergency services (911 in the [place] Do not drive yourself to the hospital. Summary  There are many types of head injuries. They can be as minor as a bump. Some head injuries can be worse  Treatment for this condition depends on how severe the injury is and the type of injury you have.  Ask your doctor when it is safe for you to go back to your normal activities, such as work or school.  To prevent a head injury, wear a seat belt in a car, wear a helmet when you use a a bicycle, limit your alcohol use, and make your home safer. This information is not intended to replace advice given to you by your health care provider. Make sure you discuss any questions you   have with your health care provider. Document Revised: 10/16/2018 Document Reviewed: 07/18/2018 Elsevier Patient Education  2020 [employer]  
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
## 2019-10-13 ENCOUNTER — Encounter: Payer: Medicaid Other | Admitting: Advanced Practice Midwife

## 2019-10-15 ENCOUNTER — Ambulatory Visit (HOSPITAL_COMMUNITY): Payer: Medicaid Other

## 2019-10-22 ENCOUNTER — Other Ambulatory Visit: Payer: Self-pay

## 2019-10-22 ENCOUNTER — Ambulatory Visit (HOSPITAL_COMMUNITY)
Admission: RE | Admit: 2019-10-22 | Discharge: 2019-10-22 | Disposition: A | Discharge status: Home / Self Care | Payer: Medicaid Other | Source: Ambulatory Visit | Attending: Obstetrics | Admitting: Obstetrics

## 2019-10-22 ENCOUNTER — Encounter: Payer: Medicaid Other | Admitting: Family Medicine

## 2019-10-22 DIAGNOSIS — Z362 Encounter for other antenatal screening follow-up: Secondary | ICD-10-CM | POA: Diagnosis not present

## 2019-10-22 DIAGNOSIS — Z3A28 28 weeks gestation of pregnancy: Secondary | ICD-10-CM

## 2019-10-23 ENCOUNTER — Other Ambulatory Visit (HOSPITAL_COMMUNITY): Payer: Self-pay | Admitting: *Deleted

## 2019-10-23 DIAGNOSIS — Z362 Encounter for other antenatal screening follow-up: Secondary | ICD-10-CM

## 2019-10-29 ENCOUNTER — Other Ambulatory Visit: Payer: Self-pay

## 2019-10-29 ENCOUNTER — Ambulatory Visit (INDEPENDENT_AMBULATORY_CARE_PROVIDER_SITE_OTHER): Payer: Medicaid Other | Admitting: Family Medicine

## 2019-10-29 VITALS — BP 104/64 | HR 96 | Wt 154.0 lb

## 2019-10-29 DIAGNOSIS — Z23 Encounter for immunization: Secondary | ICD-10-CM

## 2019-10-29 DIAGNOSIS — Z3A29 29 weeks gestation of pregnancy: Secondary | ICD-10-CM

## 2019-10-29 DIAGNOSIS — Z3480 Encounter for supervision of other normal pregnancy, unspecified trimester: Secondary | ICD-10-CM

## 2019-10-29 DIAGNOSIS — Z3483 Encounter for supervision of other normal pregnancy, third trimester: Secondary | ICD-10-CM

## 2019-10-29 DIAGNOSIS — Z8619 Personal history of other infectious and parasitic diseases: Secondary | ICD-10-CM

## 2019-10-29 DIAGNOSIS — N766 Ulceration of vulva: Secondary | ICD-10-CM

## 2019-10-29 DIAGNOSIS — Z98891 History of uterine scar from previous surgery: Secondary | ICD-10-CM

## 2019-10-29 NOTE — Progress Notes (Signed)
   PRENATAL VISIT NOTE  Subjective:  Christina Woodard is a 29 y.o. G3P1011 at [redacted]w[redacted]d being seen today for ongoing prenatal care.  She is currently monitored for the following issues for this low-risk pregnancy and has Supervision of other normal pregnancy, antepartum; History of cesarean delivery; and History of syphilis on their problem list.  Patient reports heartburn.  Contractions: Not present. Vag. Bleeding: None.  Movement: Present. Denies leaking of fluid.   The following portions of the patient's history were reviewed and updated as appropriate: allergies, current medications, past family history, past medical history, past social history, past surgical history and problem list.   Objective:   Vitals:   10/29/19 0908  BP: 104/64  Pulse: 96  Weight: 154 lb (69.9 kg)    Fetal Status: Fetal Heart Rate (bpm): 144 Fundal Height: 29 cm Movement: Present     General:  Alert, oriented and cooperative. Patient is in no acute distress.  Skin: Skin is warm and dry. No rash noted.   Cardiovascular: Normal heart rate noted  Respiratory: Normal respiratory effort, no problems with respiration noted  Abdomen: Soft, gravid, appropriate for gestational age.  Pain/Pressure: Present     Pelvic: Cervical exam deferred        Extremities: Normal range of motion.  Edema: Trace  Mental Status: Normal mood and affect. Normal behavior. Normal judgment and thought content.   Assessment and Plan:  Pregnancy: G3P1011 at [redacted]w[redacted]d 1. Supervision of other normal pregnancy, antepartum FHT and FH normal. Omeprazole for heartburn - Glucose Tolerance, 2 Hours w/1 Hour - CBC - HIV antibody (with reflex) - RPR - Tdap vaccine greater than or equal to 7yo IM  2. History of cesarean delivery Still considering vbac  3. Genital labial ulcer resolved  4. History of syphilis S/p treated. Subsequent negative test   Preterm labor symptoms and general obstetric precautions including but not limited to  vaginal bleeding, contractions, leaking of fluid and fetal movement were reviewed in detail with the patient. Please refer to After Visit Summary for other counseling recommendations.   Return in about 2 weeks (around 11/12/2019) for OB f/u.  Future Appointments  Date Time Provider Department Center  11/18/2019  1:15 PM [facility] [place] 2 [facility]    Levie Heritage, DO

## 2019-10-31 LAB — RPR, QUANT+TP ABS (REFLEX)
Rapid Plasma Reagin, Quant: 1:1 {titer} — ABNORMAL HIGH
T Pallidum Abs: REACTIVE — AB

## 2019-10-31 LAB — CBC
Hematocrit: 35.6 % (ref 34.0–46.6)
Hemoglobin: 12.1 g/dL (ref 11.1–15.9)
MCH: 31.6 pg (ref 26.6–33.0)
MCHC: 34 g/dL (ref 31.5–35.7)
MCV: 93 fL (ref 79–97)
Platelets: 235 10*3/uL (ref 150–450)
RBC: 3.83 x10E6/uL (ref 3.77–5.28)
RDW: 11.8 % (ref 11.7–15.4)
WBC: 8.9 10*3/uL (ref 3.4–10.8)

## 2019-10-31 LAB — GLUCOSE TOLERANCE, 2 HOURS W/ 1HR
Glucose, 1 hour: 153 mg/dL (ref 65–179)
Glucose, 2 hour: 99 mg/dL (ref 65–152)
Glucose, Fasting: 75 mg/dL (ref 65–91)

## 2019-10-31 LAB — RPR: RPR Ser Ql: REACTIVE — AB

## 2019-10-31 LAB — HIV ANTIBODY (ROUTINE TESTING W REFLEX): HIV Screen 4th Generation wRfx: NONREACTIVE

## 2019-11-05 NOTE — Progress Notes (Signed)
Had 2 neg RPR, now having positive RPR with + treponemal abs. Although titer is the same, will treat as new exposure and Give penicillin G 2.4 mil units weekly x 3 doses

## 2019-11-09 ENCOUNTER — Telehealth: Payer: Self-pay

## 2019-11-09 NOTE — Telephone Encounter (Signed)
Left message about RPR and per DR. Stinson patient needs 3 rounds of Penicillin G 2.4 mu (spaced one week apart). Armandina Stammer RN

## 2019-11-10 ENCOUNTER — Telehealth: Payer: Self-pay

## 2019-11-10 NOTE — Telephone Encounter (Addendum)
-----   Message from Levie Heritage, DO sent at 11/05/2019  9:45 AM EDT ----- Had 2 neg RPR, now having positive RPR with + treponemal abs. Although titer is the same, will treat as new exposure and Give penicillin G 2.4 mil units weekly x 3 doses  This is a patient of [facility] office. Encounter forwarded to correct office.

## 2019-11-11 ENCOUNTER — Encounter: Payer: Medicaid Other | Admitting: Obstetrics & Gynecology

## 2019-11-12 ENCOUNTER — Inpatient Hospital Stay (HOSPITAL_COMMUNITY)
Admission: AD | Admit: 2019-11-12 | Discharge: 2019-11-12 | Disposition: A | Discharge status: Home / Self Care | Payer: Medicaid Other | Attending: Obstetrics and Gynecology | Admitting: Obstetrics and Gynecology

## 2019-11-12 ENCOUNTER — Inpatient Hospital Stay (HOSPITAL_BASED_OUTPATIENT_CLINIC_OR_DEPARTMENT_OTHER): Payer: Medicaid Other

## 2019-11-12 ENCOUNTER — Encounter (HOSPITAL_COMMUNITY): Payer: Self-pay | Admitting: Obstetrics and Gynecology

## 2019-11-12 ENCOUNTER — Other Ambulatory Visit: Payer: Self-pay

## 2019-11-12 DIAGNOSIS — O99333 Smoking (tobacco) complicating pregnancy, third trimester: Secondary | ICD-10-CM | POA: Insufficient documentation

## 2019-11-12 DIAGNOSIS — O34219 Maternal care for unspecified type scar from previous cesarean delivery: Secondary | ICD-10-CM

## 2019-11-12 DIAGNOSIS — O368190 Decreased fetal movements, unspecified trimester, not applicable or unspecified: Secondary | ICD-10-CM

## 2019-11-12 DIAGNOSIS — F17210 Nicotine dependence, cigarettes, uncomplicated: Secondary | ICD-10-CM | POA: Insufficient documentation

## 2019-11-12 DIAGNOSIS — Z3689 Encounter for other specified antenatal screening: Secondary | ICD-10-CM

## 2019-11-12 DIAGNOSIS — Z3A31 31 weeks gestation of pregnancy: Secondary | ICD-10-CM | POA: Diagnosis not present

## 2019-11-12 DIAGNOSIS — O368130 Decreased fetal movements, third trimester, not applicable or unspecified: Secondary | ICD-10-CM | POA: Diagnosis not present

## 2019-11-12 DIAGNOSIS — O2343 Unspecified infection of urinary tract in pregnancy, third trimester: Secondary | ICD-10-CM

## 2019-11-12 HISTORY — DX: Other specified health status: Z78.9

## 2019-11-12 LAB — URINALYSIS, ROUTINE W REFLEX MICROSCOPIC
Bilirubin Urine: NEGATIVE
Glucose, UA: NEGATIVE mg/dL
Hgb urine dipstick: NEGATIVE
Ketones, ur: NEGATIVE mg/dL
Nitrite: NEGATIVE
Protein, ur: NEGATIVE mg/dL
Specific Gravity, Urine: 1.011 (ref 1.005–1.030)
pH: 8 (ref 5.0–8.0)

## 2019-11-12 MED ORDER — CEFADROXIL 500 MG PO CAPS: 500.0000 mg | ORAL_CAPSULE | Freq: Two times a day (BID) | ORAL | 0 refills | Status: DC

## 2019-11-12 MED ORDER — PHENAZOPYRIDINE HCL 100 MG PO TABS
200.0000 mg | ORAL_TABLET | Freq: Once | ORAL | Status: AC
  Administered 2019-11-12: 200 mg via ORAL
  Filled 2019-11-12: qty 2

## 2019-11-12 NOTE — Progress Notes (Signed)
Pt reports fetus active, moving as usually does.

## 2019-11-12 NOTE — Discharge Instructions (Signed)
Fetal Movement Counts Patient Name: ________________________________________________ Patient Due Date: ____________________ What is a fetal movement count?  A fetal movement count is the number of times that you feel your baby move during a certain amount of time. This may also be called a fetal kick count. A fetal movement count is recommended for every pregnant woman. You may be asked to start counting fetal movements as early as week 28 of your pregnancy. Pay attention to when your baby is most active. You may notice your baby's sleep and wake cycles. You may also notice things that make your baby move more. You should do a fetal movement count:  When your baby is normally most active.  At the same time each day. A good time to count movements is while you are resting, after having something to eat and drink. How do I count fetal movements? 1. Find a quiet, comfortable area. Sit, or lie down on your side. 2. Write down the date, the start time and stop time, and the number of movements that you felt between those two times. Take this information with you to your health care visits. 3. Write down your start time when you feel the first movement. 4. Count kicks, flutters, swishes, rolls, and jabs. You should feel at least 10 movements. 5. You may stop counting after you have felt 10 movements, or if you have been counting for 2 hours. Write down the stop time. 6. If you do not feel 10 movements in 2 hours, contact your health care provider for further instructions. Your health care provider may want to do additional tests to assess your baby's well-being. Contact a health care provider if:  You feel fewer than 10 movements in 2 hours.  Your baby is not moving like he or she usually does. Date: ____________ Start time: ____________ Stop time: ____________ Movements: ____________ Date: ____________ Start time: ____________ Stop time: ____________ Movements: ____________ Date: ____________  Start time: ____________ Stop time: ____________ Movements: ____________ Date: ____________ Start time: ____________ Stop time: ____________ Movements: ____________ Date: ____________ Start time: ____________ Stop time: ____________ Movements: ____________ Date: ____________ Start time: ____________ Stop time: ____________ Movements: ____________ Date: ____________ Start time: ____________ Stop time: ____________ Movements: ____________ Date: ____________ Start time: ____________ Stop time: ____________ Movements: ____________ Date: ____________ Start time: ____________ Stop time: ____________ Movements: ____________ This information is not intended to replace advice given to you by your health care provider. Make sure you discuss any questions you have with your health care provider. Document Revised: 02/12/2019 Document Reviewed: 02/12/2019 Elsevier Patient Education  2020 [employer].        Preterm Labor and Birth Information  The normal length of a pregnancy is 39-41 weeks. Preterm labor is when labor starts before 37 completed weeks of pregnancy. What are the risk factors for preterm labor? Preterm labor is more likely to occur in women who:  Have certain infections during pregnancy such as a bladder infection, sexually transmitted infection, or infection inside the uterus (chorioamnionitis).  Have a shorter-than-normal cervix.  Have gone into preterm labor before.  Have had surgery on their cervix.  Are younger than age 21 or older than age 56.  Are African American.  Are pregnant with twins or multiple babies (multiple gestation).  Take street drugs or smoke while pregnant.  Do not gain enough weight while pregnant.  Became pregnant shortly after having been pregnant. What are the symptoms of preterm labor? Symptoms of preterm labor include:  Cramps similar to those that  can happen during a menstrual period. The cramps may happen with diarrhea.  Pain in the  abdomen or lower back.  Regular uterine contractions that may feel like tightening of the abdomen.  A feeling of increased pressure in the pelvis.  Increased watery or bloody mucus discharge from the vagina.  Water breaking (ruptured amniotic sac). Why is it important to recognize signs of preterm labor? It is important to recognize signs of preterm labor because babies who are born prematurely may not be fully developed. This can put them at an increased risk for:  Long-term (chronic) heart and lung problems.  Difficulty immediately after birth with regulating body systems, including blood sugar, body temperature, heart rate, and breathing rate.  Bleeding in the brain.  Cerebral palsy.  Learning difficulties.  Death. These risks are highest for babies who are born before 34 weeks of pregnancy. How is preterm labor treated? Treatment depends on the length of your pregnancy, your condition, and the health of your baby. It may involve:  Having a stitch (suture) placed in your cervix to prevent your cervix from opening too early (cerclage).  Taking or being given medicines, such as: ? Hormone medicines. These may be given early in pregnancy to help support the pregnancy. ? Medicine to stop contractions. ? Medicines to help mature the baby's lungs. These may be prescribed if the risk of delivery is high. ? Medicines to prevent your baby from developing cerebral palsy. If the labor happens before 34 weeks of pregnancy, you may need to stay in the hospital. What should I do if I think I am in preterm labor? If you think that you are going into preterm labor, call your health care provider right away. How can I prevent preterm labor in future pregnancies? To increase your chance of having a full-term pregnancy:  Do not use any tobacco products, such as cigarettes, chewing tobacco, and e-cigarettes. If you need help quitting, ask your health care provider.  Do not use street drugs  or medicines that have not been prescribed to you during your pregnancy.  Talk with your health care provider before taking any herbal supplements, even if you have been taking them regularly.  Make sure you gain a healthy amount of weight during your pregnancy.  Watch for infection. If you think that you might have an infection, get it checked right away.  Make sure to tell your health care provider if you have gone into preterm labor before. This information is not intended to replace advice given to you by your health care provider. Make sure you discuss any questions you have with your health care provider. Document Revised: 10/17/2018 Document Reviewed: 11/16/2015 Elsevier Patient Education  2020 [employer]        Pregnancy and Urinary Tract Infection  A urinary tract infection (UTI) is an infection of any part of the urinary tract. This includes the kidneys, the tubes that connect your kidneys to your bladder (ureters), the bladder, and the tube that carries urine out of your body (urethra). These organs make, store, and get rid of urine in the body. Your health care provider may use other names to describe the infection. An upper UTI affects the ureters and kidneys (pyelonephritis). A lower UTI affects the bladder (cystitis) and urethra (urethritis). Most urinary tract infections are caused by bacteria in your genital area, around the entrance to your urinary tract (urethra). These bacteria grow and cause irritation and inflammation of your urinary tract. You are more likely  to develop a UTI during pregnancy because the physical and hormonal changes your body goes through can make it easier for bacteria to get into your urinary tract. Your growing baby also puts pressure on your bladder and can affect urine flow. It is important to recognize and treat UTIs in pregnancy because of the risk of serious complications for both you and your baby. How does this affect me? Symptoms of a  UTI include:  Needing to urinate right away (urgently).  Frequent urination or passing small amounts of urine frequently.  Pain or burning with urination.  Blood in the urine.  Urine that smells bad or unusual.  Trouble urinating.  Cloudy urine.  Pain in the abdomen or lower back.  Vaginal discharge. You may also have:  Vomiting or a decreased appetite.  Confusion.  Irritability or tiredness.  A fever.  Diarrhea. How does this affect my baby? An untreated UTI during pregnancy could lead to a kidney infection or a systemic infection, which can cause health problems that could affect your baby. Possible complications of an untreated UTI include:  Giving birth to your baby before 37 weeks of pregnancy (premature).  Having a baby with a low birth weight.  Developing high blood pressure during pregnancy (preeclampsia).  Having a low hemoglobin level (anemia). What can I do to lower my risk? To prevent a UTI:  Go to the bathroom as soon as you feel the need. Do not hold urine for long periods of time.  Always wipe from front to back, especially after a bowel movement. Use each tissue one time when you wipe.  Empty your bladder after sex.  Keep your genital area dry.  Drink 6-10 glasses of water each day.  Do not douche or use deodorant sprays. How is this treated? Treatment for this condition may include:  Antibiotic medicines that are safe to take during pregnancy.  Other medicines to treat less common causes of UTI. Follow these instructions at home:  If you were prescribed an antibiotic medicine, take it as told by your health care provider. Do not stop using the antibiotic even if you start to feel better.  Keep all follow-up visits as told by your health care provider. This is important. Contact a health care provider if:  Your symptoms do not improve or they get worse.  You have abnormal vaginal discharge. Get help right away if you:  Have a  fever.  Have nausea and vomiting.  Have back or side pain.  Feel contractions in your uterus.  Have lower belly pain.  Have a gush of fluid from your vagina.  Have blood in your urine. Summary  A urinary tract infection (UTI) is an infection of any part of the urinary tract, which includes the kidneys, ureters, bladder, and urethra.  Most urinary tract infections are caused by bacteria in your genital area, around the entrance to your urinary tract (urethra).  You are more likely to develop a UTI during pregnancy.  If you were prescribed an antibiotic medicine, take it as told by your health care provider. Do not stop using the antibiotic even if you start to feel better. This information is not intended to replace advice given to you by your health care provider. Make sure you discuss any questions you have with your health care provider. Document Revised: 10/17/2018 Document Reviewed: 05/29/2018 Elsevier Patient Education  2020 [employer] Inc.        Pyelonephritis During Pregnancy  What are the causes? This condition  is caused by a bacterial infection in the lower urinary tract that spreads to the kidney. What increases the risk? You are more likely to develop this condition if:  You have diabetes.  You have a history of frequent urinary tract infections. What are the signs or symptoms? Symptoms of this condition may begin with symptoms of a lower urinary tract infection. These may include:  A frequent urge to pass urine.  Burning pain when passing urine.  Pain and pressure in your lower abdomen.  Blood in your urine.  Cloudy or smelly urine. As the infection spreads to your kidney, you may have these symptoms:  Fever.  Chills.  Pain and tenderness in your upper abdomen or in your back and sides (flank pain). Flank pain often affects one side of the body, usually the right side.  Nausea, vomiting, or loss of appetite. How is this diagnosed? This  condition may be diagnosed based on:  Your symptoms and medical history.  A physical exam.  Tests to confirm the diagnosis. These may include: ? Blood tests to check kidney function and look for signs of infection. ? Urine tests to check for signs of infection, including bacteria, white or red blood cells, and protein. ? Tests to grow and identify the type of bacteria that is causing the infection (urine culture). ? Imaging studies of your kidneys to learn more about your condition. How is this treated? This condition is treated in the hospital with antibiotics that are given into one of your veins through an IV. Your health care provider:  Will start you on an antibiotic that is effective against common urinary tract infections.  May switch to another antibiotic if the results of your urine culture show that your infection is caused by different bacteria.  Will be careful to choose antibiotics that are the safest during pregnancy. Other treatments may include:  IV fluids if you are nauseous and not able to drink fluids.  Pain and fever medicines.  Medicines for nausea and vomiting. You will be able to go home when your infection is under control. To prevent another infection, you may need to continue taking antibiotics by mouth until your baby is born. Follow these instructions at home: Medicines   Take over-the-counter and prescription medicines only as told by your health care provider.  Take your antibiotic medicine as told by your health care provider. Do not stop taking the antibiotic even if you start to feel better.  Continue to take your prenatal vitamins. Lifestyle   Follow instructions from your health care provider about eating or drinking restrictions. You may need to avoid: ? Foods and drinks with added sugar. ? Caffeine and fruit juice.  Drink enough fluid to keep your urine pale yellow.  Go to the bathroom frequently. Do not hold your urine. Try to empty  your bladder completely.  Change your underwear every day. Wear all-cotton underwear. Do not wear tight underwear or pants. General instructions   Take these steps to lower the risk of bacteria getting into your urinary tract: ? Use liquid soap instead of bar soap when showering or bathing. Bacteria can grow on bar soap. ? When you wash yourself, clean the urethra opening first. Use a washcloth to clean the area between your vagina and anus. Pat the area dry with a clean towel. ? Wash your hands before and after you go to the bathroom. ? Wipe yourself from front to back after going to the bathroom. ? Do not use  douches, perfumed soap, creams, or powders. ? Do not soak in a bath for more than 30 minutes.  Return to your normal activities as told by your health care provider. Ask your health care provider what activities are safe for you.  Keep all follow-up visits as told by your health care provider. This is important. Contact a health care provider if:  You have chills or a fever.  You have any symptoms of infection that do not get better at home.  Symptoms of infection come back.  You have a reaction or side effects from your antibiotic. Get help right away if:  You start having contractions. Summary  Pyelonephritis is an infection of the kidney or kidneys.  This condition results when a bacterial infection in the lower urinary tract spreads to the kidney.  Lower urinary tract infections are common during pregnancy.  Pyelonephritis causes chills, a fever, flank pain, and nausea.  Pyelonephritis is a serious infection that is usually treated in the hospital with IV antibiotics. This information is not intended to replace advice given to you by your health care provider. Make sure you discuss any questions you have with your health care provider. Document Revised: 10/17/2018 Document Reviewed: 09/26/2017 Elsevier Patient Education  [place].

## 2019-11-12 NOTE — Progress Notes (Signed)
N. Nugent, NP @ bedside discussing POC and planning to do VE.

## 2019-11-12 NOTE — MAU Provider Note (Signed)
History     CSN: [id number]  Arrival date and time: 11/12/19 1413   First Provider Initiated Contact with Patient 11/12/19 1519      Chief Complaint  Patient presents with  . Decreased Fetal Movement  . Abdominal Pain  . Urinary Frequency   Ms. Christina Woodard is a 29 y.o. G3P1011 at [redacted]w[redacted]d who presents to MAU for feeling decreased fetal movement beginning yesterday morning. Patient reports more movement since coming to MAU, but denies a return to normal. Patient reports she did not feel any fetal movement yesterday, but has felt some today. Patient also reports pelvic pressure that has been going on for 2-3 weeks and reports it was worse a week ago, but is mild now.  Last food/drink: taco, soda, water 145PM Smoker? cigarettes Current medications/supplements: Benadryl PRN (last took this morning at 11/12), Prilosec PRN, TUMS PRN, PNVs Recent AFI: 9.9cm on 10/22/2019 Anterior placenta? yes Doing FKCs? no Problems this pregnancy include: previous C/S Pt denies prior instances of DFM. Pt denies all risk factors for stillbirth, including, but not limited to: IUGR, placental abruption, infection, genetic/congenital anomalies, fetomaternal hemorrhage, DM, HTN, smoking/drug use, umbilical cord/placental abnormalities, uterine abnormalities, fetal hydrops, arrythmia, platelet dysfunction, IHCP.  Pt denies VB, LOF, ctx, vaginal discharge/odor/itching.  Patient also reports urinary frequency, urgency and lower mid-line pelvic discomfort. Patient denies dysuria.  Allergies? NKDA Prenatal care provider/next appt? [facility], 11/14/2019   OB History    Gravida  3   Para  1   Term  1   Preterm      AB  1   Living  1     SAB  1   TAB      Ectopic      Multiple      Live Births  1           Past Medical History:  Diagnosis Date  . Medical history non-contributory     Past Surgical History:  Procedure Laterality Date  . CESAREAN SECTION    . CESAREAN SECTION       Family History  Problem Relation Age of Onset  . Healthy Mother   . Healthy Father     Social History   Tobacco Use  . Smoking status: Current Every Day Smoker    Packs/day: 1.00    Types: Cigarettes  . Smokeless tobacco: Never Used  Substance Use Topics  . Alcohol use: Yes    Comment: occ  . Drug use: Yes    Types: Marijuana    Comment: last smoked January 2021    Allergies: No Known Allergies  Medications Prior to Admission  Medication Sig Dispense Refill Last Dose  . acetaminophen (TYLENOL) 325 MG tablet Take 650 mg by mouth every 6 (six) hours as needed.   Past Month at Unknown time  . Prenatal Vit-Fe Fumarate-FA (PRENATAL COMPLETE) 14-0.4 MG TABS Take one tablet daily 60 tablet 4 11/11/2019 at 1930  . calcium carbonate (TUMS - DOSED IN MG ELEMENTAL CALCIUM) 500 MG chewable tablet Chew 1 tablet by mouth daily.     . diphenhydrAMINE (BENADRYL) 25 MG tablet Take 25 mg by mouth every 6 (six) hours as needed. For itching      . metroNIDAZOLE (FLAGYL) 500 MG tablet Take 1 tablet (500 mg total) by mouth 2 (two) times daily. (Patient not taking: Reported on 09/11/2019) 14 tablet 0   . Multiple Vitamin (MULTIVITAMIN) tablet Take 1 tablet by mouth daily.       Marland Kitchen  nicotine (NICODERM CQ) 14 mg/24hr patch Place 1 patch (14 mg total) onto the skin daily. (Patient not taking: Reported on 10/29/2019) 28 patch 1   . predniSONE (DELTASONE) 20 MG tablet Take 2 tablets (40 mg total) by mouth daily. (Patient not taking: Reported on 09/11/2019) 10 tablet 0   . valACYclovir (VALTREX) 1000 MG tablet Take 1 tablet (1,000 mg total) by mouth 3 (three) times daily. (Patient not taking: Reported on 09/11/2019) 21 tablet 0     Review of Systems  Constitutional: Negative for chills, diaphoresis, fatigue and fever.  Eyes: Negative for visual disturbance.  Respiratory: Negative for shortness of breath.   Cardiovascular: Negative for chest pain.  Gastrointestinal: Negative for abdominal pain, constipation,  diarrhea, nausea and vomiting.  Genitourinary: Positive for frequency, pelvic pain and urgency. Negative for dysuria, flank pain, vaginal bleeding and vaginal discharge.  Neurological: Negative for dizziness, weakness, light-headedness and headaches.   Physical Exam   Blood pressure 131/82, pulse 93, temperature 98.8 F (37.1 C), temperature source Oral, resp. rate 20, height 5\' 5"  (1.651 m), weight 71.8 kg, last menstrual period 04/09/2019, SpO2 99 %.  Patient Vitals for the past 24 hrs:  BP Temp Temp src Pulse Resp SpO2 Height Weight  11/12/19 1813 131/82 98.8 F (37.1 C) Oral 93 20 99 % -- --  11/12/19 1432 107/64 98.7 F (37.1 C) Oral 95 18 99 % 5\' 5"  (1.651 m) 71.8 kg   Physical Exam  Constitutional: She is oriented to person, place, and time. She appears well-developed and well-nourished. No distress.  HENT:  Head: Normocephalic and atraumatic.  Respiratory: Effort normal.  GI: Soft. She exhibits no distension and no mass. There is no abdominal tenderness. There is no rebound and no guarding.  Genitourinary: There is no rash, tenderness or lesion on the right labia. There is no rash, tenderness or lesion on the left labia.    Genitourinary Comments: CE: long/closed/posterior   Neurological: She is alert and oriented to person, place, and time.  Skin: Skin is warm and dry. She is not diaphoretic.  Psychiatric: She has a normal mood and affect. Her behavior is normal. Judgment and thought content normal.   Results for orders placed or performed during the hospital encounter of 11/12/19 (from the past 24 hour(s))  Urinalysis, Routine w reflex microscopic     Status: Abnormal   Collection Time: 11/12/19  3:57 PM  Result Value Ref Range   Color, Urine YELLOW YELLOW   APPearance CLOUDY (A) CLEAR   Specific Gravity, Urine 1.011 1.005 - 1.030   pH 8.0 5.0 - 8.0   Glucose, UA NEGATIVE NEGATIVE mg/dL   Hgb urine dipstick NEGATIVE NEGATIVE   Bilirubin Urine NEGATIVE NEGATIVE    Ketones, ur NEGATIVE NEGATIVE mg/dL   Protein, ur NEGATIVE NEGATIVE mg/dL   Nitrite NEGATIVE NEGATIVE   Leukocytes,Ua MODERATE (A) NEGATIVE   RBC / HPF 0-5 0 - 5 RBC/hpf   WBC, UA 0-5 0 - 5 WBC/hpf   Bacteria, UA RARE (A) NONE SEEN   Squamous Epithelial / LPF 0-5 0 - 5   Amorphous Crystal PRESENT     MAU Course  Procedures  MDM -DFM since yesterday -iced juice and snacks brought by patient from outside restaurant to eat in MAU -after drink/snacks, pt reports to RN at 530PM return of normal fetal movement -EFM: reactive       -baseline: 135/140/130       -variability: moderate       -accels: present, 15x15       -  decels: absent       -TOCO: no ctx -BPP ordered, 8/8, VTX, anterior placenta, AFI 9.3cm  -possible UTI with sx of frequency, urgency, mid-line pelvic discomfort -pyridium given in MAU, symptoms resolved, fFN not performed, pt also reports intercourse in last 24hrs -UA: cloudy/mod leuks/rare bacteria, sending urine for culture, will treat based on sx -CE: long/closed/posterior  -pt discharged to home in stable condition  Orders Placed This Encounter  Procedures  . Culture, OB Urine    Standing Status:   Standing    Number of Occurrences:   1  . [place] MFM FETAL BPP WO NON STRESS    Standing Status:   Standing    Number of Occurrences:   1    Order Specific Question:   Symptom/Reason for Exam    Answer:   Decreased fetal movement [[id number]]  . Urinalysis, Routine w reflex microscopic    Standing Status:   Standing    Number of Occurrences:   1  . Discharge patient    Order Specific Question:   Discharge disposition    Answer:   01-Home or Self Care [1]    Order Specific Question:   Discharge patient date    Answer:   11/12/2019   Meds ordered this encounter  Medications  . phenazopyridine (PYRIDIUM) tablet 200 mg  . cefadroxil (DURICEF) 500 MG capsule    Sig: Take 1 capsule (500 mg total) by mouth 2 (two) times daily.    Dispense:  7 capsule    Refill:  0     Order Specific Question:   Supervising Provider    Answer:   CONSTANT, PEGGY [[id number]]    Assessment and Plan   1. Decreased fetal movement   2. Urinary tract infection in mother during third trimester of pregnancy   3. [redacted] weeks gestation of pregnancy   4. NST (non-stress test) reactive    Allergies as of 11/12/2019   No Known Allergies     Medication List    TAKE these medications   acetaminophen 325 MG tablet Commonly known as: TYLENOL Take 650 mg by mouth every 6 (six) hours as needed.   calcium carbonate 500 MG chewable tablet Commonly known as: TUMS - dosed in mg elemental calcium Chew 1 tablet by mouth daily.   cefadroxil 500 MG capsule Commonly known as: DURICEF Take 1 capsule (500 mg total) by mouth 2 (two) times daily.   diphenhydrAMINE 25 MG tablet Commonly known as: BENADRYL Take 25 mg by mouth every 6 (six) hours as needed. For itching   metroNIDAZOLE 500 MG tablet Commonly known as: FLAGYL Take 1 tablet (500 mg total) by mouth 2 (two) times daily.   multivitamin tablet Take 1 tablet by mouth daily.   nicotine 14 mg/24hr patch Commonly known as: Nicoderm CQ Place 1 patch (14 mg total) onto the skin daily.   predniSONE 20 MG tablet Commonly known as: DELTASONE Take 2 tablets (40 mg total) by mouth daily.   Prenatal Complete 14-0.4 MG Tabs Take one tablet daily   valACYclovir 1000 MG tablet Commonly known as: VALTREX Take 1 tablet (1,000 mg total) by mouth 3 (three) times daily.      -will call with culture results, if needing change in ABX -RX cefadroxil sent -discussed s/sx of pyelonephritis -discussed s/sx of PTL -discussed that fetal movement varies somewhat depending on the time of day and gestational age and there is a wide variety of normal movement among healthy babies -discussed that frequency of movement  typically increases from morning to night, with peak activity late at night -discussed that fetal sleep cycles become longer with  advancing gestation and can last about 20-72minutes -pt advised to contact HCP immediately if noticing a decrease in fetal movement compared to what is normal -discussed FKCs vs. monitoring movements; can either do fetal kick counting, or just be aware of what is normal for baby in terms of movement; no one method is better than the other -FKC: at least 10 fetal movements (FMs) over up to two hours when at rest and focused on counting -return MAU precautions given -pt discharged to home in stable condition  Joni Reining E Felesia Stahlecker 11/12/2019, 6:19 PM

## 2019-11-12 NOTE — MAU Note (Signed)
Wasn't feeling well yesterday, took some allergy med- is feeling better now.  Yesterday did not feel any movement at all.  Today has felt maybe 6 moves, mainly since got here.  Feeling some discomfort in lower abd, started about an hour ago.frequency and urgency during the night, but scant amt- no pain

## 2019-11-13 ENCOUNTER — Encounter: Payer: Medicaid Other | Admitting: Obstetrics & Gynecology

## 2019-11-13 LAB — CULTURE, OB URINE: Culture: NO GROWTH

## 2019-11-18 ENCOUNTER — Ambulatory Visit (HOSPITAL_COMMUNITY): Payer: Medicaid Other

## 2019-11-19 ENCOUNTER — Telehealth: Payer: Self-pay

## 2019-11-19 ENCOUNTER — Encounter: Payer: Medicaid Other | Admitting: Obstetrics & Gynecology

## 2019-11-19 NOTE — Telephone Encounter (Signed)
Attempted to reach patient due to missing her OB appointment this morning at 10am.  Patient needs treated for last positive RPR per Dr. Adrian Blackwater note.   Unable to leave voicemail for patient because mailbox is full. Armandina Stammer RN

## 2019-11-23 ENCOUNTER — Ambulatory Visit (HOSPITAL_COMMUNITY): Payer: Medicaid Other

## 2019-11-24 ENCOUNTER — Ambulatory Visit (HOSPITAL_COMMUNITY): Payer: Medicaid Other | Attending: Obstetrics

## 2019-12-01 ENCOUNTER — Ambulatory Visit (INDEPENDENT_AMBULATORY_CARE_PROVIDER_SITE_OTHER): Payer: Medicaid Other | Admitting: Advanced Practice Midwife

## 2019-12-01 ENCOUNTER — Other Ambulatory Visit: Payer: Self-pay

## 2019-12-01 ENCOUNTER — Encounter: Payer: Self-pay | Admitting: Advanced Practice Midwife

## 2019-12-01 VITALS — BP 117/75 | HR 100

## 2019-12-01 DIAGNOSIS — A539 Syphilis, unspecified: Secondary | ICD-10-CM

## 2019-12-01 DIAGNOSIS — Z3480 Encounter for supervision of other normal pregnancy, unspecified trimester: Secondary | ICD-10-CM

## 2019-12-01 DIAGNOSIS — Z8619 Personal history of other infectious and parasitic diseases: Secondary | ICD-10-CM

## 2019-12-01 DIAGNOSIS — Z3A33 33 weeks gestation of pregnancy: Secondary | ICD-10-CM

## 2019-12-01 DIAGNOSIS — O34219 Maternal care for unspecified type scar from previous cesarean delivery: Secondary | ICD-10-CM | POA: Diagnosis not present

## 2019-12-01 DIAGNOSIS — Z98891 History of uterine scar from previous surgery: Secondary | ICD-10-CM

## 2019-12-01 MED ORDER — PENICILLIN G BENZATHINE 1200000 UNIT/2ML IM SUSP
2.4000 10*6.[IU] | INTRAMUSCULAR | Status: DC
  Administered 2019-12-01: 2.4 10*6.[IU] via INTRAMUSCULAR

## 2019-12-01 NOTE — Progress Notes (Signed)
   PRENATAL VISIT NOTE  Subjective:  Christina Woodard is a 29 y.o. G3P1011 at [redacted]w[redacted]d being seen today for ongoing prenatal care.  She is currently monitored for the following issues for this low-risk pregnancy and has Supervision of other normal pregnancy, antepartum; History of cesarean delivery; and History of syphilis on their problem list.  Patient reports no complaints.  Contractions: Not present. Vag. Bleeding: None.  Movement: Present. Denies leaking of fluid.   The following portions of the patient's history were reviewed and updated as appropriate: allergies, current medications, past family history, past medical history, past social history, past surgical history and problem list.   Objective:   Vitals:   12/01/19 1026  BP: 117/75  Pulse: 100    Fetal Status:     Movement: Present     General:  Alert, oriented and cooperative. Patient is in no acute distress.  Skin: Skin is warm and dry. No rash noted.   Cardiovascular: Normal heart rate noted  Respiratory: Normal respiratory effort, no problems with respiration noted  Abdomen: Soft, gravid, appropriate for gestational age.  Pain/Pressure: Present     Pelvic: Cervical exam deferred        Extremities: Normal range of motion.  Edema: Trace  Mental Status: Normal mood and affect. Normal behavior. Normal judgment and thought content.   Assessment and Plan:  Pregnancy: G3P1011 at [redacted]w[redacted]d 1. History of syphilis     First dose of PCN 2.32mu IM given today, repeat x2  2. History of cesarean delivery     Considering TOLAC  3. Supervision of other normal pregnancy, antepartum     No complaints  Preterm labor symptoms and general obstetric precautions including but not limited to vaginal bleeding, contractions, leaking of fluid and fetal movement were reviewed in detail with the patient. Please refer to After Visit Summary for other counseling recommendations.   No follow-ups on file.  Future Appointments  Date Time  Provider Department Center  12/08/2019 10:00 AM [facility] NURSE [facility] None  12/18/2019 10:30 AM Levie Heritage, DO [facility] None  01/01/2020 10:30 AM Adrian Blackwater Rhona Raider, DO [facility] None    Wynelle Bourgeois, CNM

## 2019-12-01 NOTE — Patient Instructions (Signed)

## 2019-12-06 ENCOUNTER — Encounter (HOSPITAL_COMMUNITY): Payer: Self-pay | Admitting: Obstetrics and Gynecology

## 2019-12-06 ENCOUNTER — Other Ambulatory Visit: Payer: Self-pay

## 2019-12-06 ENCOUNTER — Inpatient Hospital Stay (HOSPITAL_COMMUNITY)
Admission: AD | Admit: 2019-12-06 | Discharge: 2019-12-06 | Disposition: A | Discharge status: Home / Self Care | Payer: Medicaid Other | Attending: Obstetrics and Gynecology | Admitting: Obstetrics and Gynecology

## 2019-12-06 DIAGNOSIS — F17210 Nicotine dependence, cigarettes, uncomplicated: Secondary | ICD-10-CM | POA: Diagnosis not present

## 2019-12-06 DIAGNOSIS — O99333 Smoking (tobacco) complicating pregnancy, third trimester: Secondary | ICD-10-CM | POA: Insufficient documentation

## 2019-12-06 DIAGNOSIS — O479 False labor, unspecified: Secondary | ICD-10-CM

## 2019-12-06 DIAGNOSIS — Z3A34 34 weeks gestation of pregnancy: Secondary | ICD-10-CM | POA: Diagnosis not present

## 2019-12-06 DIAGNOSIS — O4703 False labor before 37 completed weeks of gestation, third trimester: Secondary | ICD-10-CM

## 2019-12-06 DIAGNOSIS — Z3480 Encounter for supervision of other normal pregnancy, unspecified trimester: Secondary | ICD-10-CM

## 2019-12-06 DIAGNOSIS — O26893 Other specified pregnancy related conditions, third trimester: Secondary | ICD-10-CM | POA: Insufficient documentation

## 2019-12-06 DIAGNOSIS — Z7952 Long term (current) use of systemic steroids: Secondary | ICD-10-CM | POA: Insufficient documentation

## 2019-12-06 DIAGNOSIS — Z79899 Other long term (current) drug therapy: Secondary | ICD-10-CM | POA: Diagnosis not present

## 2019-12-06 LAB — URINALYSIS, ROUTINE W REFLEX MICROSCOPIC
Bilirubin Urine: NEGATIVE
Glucose, UA: NEGATIVE mg/dL
Hgb urine dipstick: NEGATIVE
Ketones, ur: NEGATIVE mg/dL
Leukocytes,Ua: NEGATIVE
Nitrite: NEGATIVE
Protein, ur: NEGATIVE mg/dL
Specific Gravity, Urine: 1.008 (ref 1.005–1.030)
pH: 7 (ref 5.0–8.0)

## 2019-12-06 NOTE — MAU Note (Signed)
Christina Woodard is a 29 y.o. at [redacted]w[redacted]d here in MAU reporting: the night before last night she went to work and thinks her mucus plug came out. Last night and this AM has had some increased pressure. No pain at this time, having some pressure. No bleeding. No LOF. +FM  Onset of complaint: ongoing  Pain score: 0/10  Vitals:   12/06/19 1052  BP: 120/76  Pulse: (!) 109  Resp: 18  Temp: 98.3 F (36.8 C)  SpO2: 100%     FHT: +FM  Lab orders placed from triage: UA

## 2019-12-06 NOTE — Discharge Instructions (Signed)

## 2019-12-06 NOTE — MAU Provider Note (Signed)
First Provider Initiated Contact with Patient 12/06/19 1304      Chief Complaint:  Vaginal Discharge   LYNITA GROSECLOSE is  29 y.o. G3P1011 at [redacted]w[redacted]d presents complaining of Vaginal Discharge .  She states she is not having contractions, none vaginal bleeding, intact membranes, along with active fetal movement. She had a quarter sized glob of mucus come out of her vagina last night.  Felt intermittent pressure q 30 minutes for a few hours.   Obstetrical/Gynecological History: OB History    Gravida  3   Para  1   Term  1   Preterm      AB  1   Living  1     SAB  1   TAB      Ectopic      Multiple      Live Births  1          Past Medical History: Past Medical History:  Diagnosis Date  . Medical history non-contributory   . Syphilis     Past Surgical History: Past Surgical History:  Procedure Laterality Date  . CESAREAN SECTION    . CESAREAN SECTION      Family History: Family History  Problem Relation Age of Onset  . Healthy Mother   . Healthy Father     Social History: Social History   Tobacco Use  . Smoking status: Current Every Day Smoker    Packs/day: 1.00    Types: Cigarettes  . Smokeless tobacco: Never Used  Substance Use Topics  . Alcohol use: Yes    Comment: occ  . Drug use: Yes    Types: Marijuana    Comment: last smoked January 2021    Allergies: No Known Allergies  Meds:  Facility-Administered Medications Prior to Admission  Medication Dose Route Frequency Provider Last Rate Last Admin  . penicillin g benzathine (BICILLIN LA) 1200000 UNIT/2ML injection 2.4 Million Units  2.4 Million Units Intramuscular Weekly Seabron Spates, CNM   2.4 Million Units at 12/01/19 1035   Medications Prior to Admission  Medication Sig Dispense Refill Last Dose  . acetaminophen (TYLENOL) 325 MG tablet Take 650 mg by mouth every 6 (six) hours as needed.     . calcium carbonate (TUMS - DOSED IN MG ELEMENTAL CALCIUM) 500 MG chewable tablet  Chew 1 tablet by mouth daily.     . cefadroxil (DURICEF) 500 MG capsule Take 1 capsule (500 mg total) by mouth 2 (two) times daily. 7 capsule 0   . diphenhydrAMINE (BENADRYL) 25 MG tablet Take 25 mg by mouth every 6 (six) hours as needed. For itching      . metroNIDAZOLE (FLAGYL) 500 MG tablet Take 1 tablet (500 mg total) by mouth 2 (two) times daily. 14 tablet 0   . Multiple Vitamin (MULTIVITAMIN) tablet Take 1 tablet by mouth daily.       . nicotine (NICODERM CQ) 14 mg/24hr patch Place 1 patch (14 mg total) onto the skin daily. 28 patch 1   . predniSONE (DELTASONE) 20 MG tablet Take 2 tablets (40 mg total) by mouth daily. 10 tablet 0   . Prenatal Vit-Fe Fumarate-FA (PRENATAL COMPLETE) 14-0.4 MG TABS Take one tablet daily 60 tablet 4   . valACYclovir (VALTREX) 1000 MG tablet Take 1 tablet (1,000 mg total) by mouth 3 (three) times daily. 21 tablet 0     Review of Systems   Constitutional: Negative for fever and chills Eyes: Negative for visual disturbances Respiratory: Negative for shortness  of breath, dyspnea Cardiovascular: Negative for chest pain or palpitations  Gastrointestinal: Negative for vomiting, diarrhea and constipation Genitourinary: Negative for dysuria and urgency Musculoskeletal: Negative for back pain, joint pain, myalgias.  Normal ROM  Neurological: Negative for dizziness and headaches    Physical Exam  Blood pressure 120/76, pulse (!) 109, temperature 98.3 F (36.8 C), temperature source Oral, resp. rate 18, height 5\' 5"  (1.651 m), weight 71.3 kg, last menstrual period 04/09/2019, SpO2 100 %. GENERAL: Well-developed, well-nourished female in no acute distress.  LUNGS: Normal respiratory effort HEART: Regular rate and rhythm. ABDOMEN: Soft, nontender, nondistended, gravid.  EXTREMITIES: Nontender, no edema, 2+ distal pulses. DTR's 2+ CERVICAL EXAM: Dilatation 0.5cm   Effacement 0%   Station -3   Presentation: cephalic FHT:  Baseline rate 145 bpm   Variability  moderate  Accelerations present   Decelerations none Contractions: Every 0 mins   Labs: Results for orders placed or performed during the hospital encounter of 12/06/19 (from the past 24 hour(s))  Urinalysis, Routine w reflex microscopic   Collection Time: 12/06/19 10:51 AM  Result Value Ref Range   Color, Urine YELLOW YELLOW   APPearance CLEAR CLEAR   Specific Gravity, Urine 1.008 1.005 - 1.030   pH 7.0 5.0 - 8.0   Glucose, UA NEGATIVE NEGATIVE mg/dL   Hgb urine dipstick NEGATIVE NEGATIVE   Bilirubin Urine NEGATIVE NEGATIVE   Ketones, ur NEGATIVE NEGATIVE mg/dL   Protein, ur NEGATIVE NEGATIVE mg/dL   Nitrite NEGATIVE NEGATIVE   Leukocytes,Ua NEGATIVE NEGATIVE   Imaging Studies:    Assessment: LANISA ISHLER is  29 y.o. G3P1011 at [redacted]w[redacted]d presents with lost of mucus plug last night, not in labor.  Plan: DC home.  Plans TOLAC, labor precautions discussed and given in writing  [redacted]w[redacted]d 5/30/20211:11 PM

## 2019-12-08 ENCOUNTER — Ambulatory Visit (INDEPENDENT_AMBULATORY_CARE_PROVIDER_SITE_OTHER): Payer: Medicaid Other

## 2019-12-08 ENCOUNTER — Other Ambulatory Visit: Payer: Self-pay

## 2019-12-08 VITALS — BP 109/63 | HR 106 | Wt 156.0 lb

## 2019-12-08 DIAGNOSIS — Z3A35 35 weeks gestation of pregnancy: Secondary | ICD-10-CM

## 2019-12-08 DIAGNOSIS — O98119 Syphilis complicating pregnancy, unspecified trimester: Secondary | ICD-10-CM

## 2019-12-08 MED ORDER — PENICILLIN G BENZATHINE 1200000 UNIT/2ML IM SUSP
2.4000 10*6.[IU] | Freq: Once | INTRAMUSCULAR | Status: AC
  Administered 2019-12-08: 2.4 10*6.[IU] via INTRAMUSCULAR

## 2019-12-08 NOTE — Progress Notes (Signed)
Christina Woodard here for Penicillin G Injection.  Injection administered without complication. Patient will return in one week for next injection.  Murdock Jellison l Lorrie Gargan, CMA 12/08/2019  11:46 AM

## 2019-12-18 ENCOUNTER — Encounter: Payer: Medicaid Other | Admitting: Family Medicine

## 2019-12-22 ENCOUNTER — Encounter: Payer: Medicaid Other | Admitting: Advanced Practice Midwife

## 2019-12-24 ENCOUNTER — Other Ambulatory Visit: Payer: Self-pay

## 2019-12-24 ENCOUNTER — Ambulatory Visit (INDEPENDENT_AMBULATORY_CARE_PROVIDER_SITE_OTHER): Payer: Medicaid Other

## 2019-12-24 DIAGNOSIS — Z3A37 37 weeks gestation of pregnancy: Secondary | ICD-10-CM | POA: Diagnosis not present

## 2019-12-24 DIAGNOSIS — O98113 Syphilis complicating pregnancy, third trimester: Secondary | ICD-10-CM

## 2019-12-24 DIAGNOSIS — O98119 Syphilis complicating pregnancy, unspecified trimester: Secondary | ICD-10-CM

## 2019-12-24 DIAGNOSIS — A539 Syphilis, unspecified: Secondary | ICD-10-CM

## 2019-12-24 MED ORDER — PENICILLIN G BENZATHINE 1200000 UNIT/2ML IM SUSP
2.4000 10*6.[IU] | Freq: Once | INTRAMUSCULAR | Status: AC
  Administered 2019-12-24: 2.4 10*6.[IU] via INTRAMUSCULAR

## 2019-12-24 NOTE — Progress Notes (Signed)
Patient presents for 3rd injection of penicillin 2.4 million units for treatment of syphillis affecting pregnancy. Patient missed ob appointment on Tuesday and rescheduled her OB appt next week. Patient reports good fetal movement and denies any bleeding.  Armandina Stammer RN   PXT:[id number].

## 2019-12-24 NOTE — Progress Notes (Signed)
Chart reviewed for nurse visit. Agree with plan of care.   Duane Lope, NP 12/24/2019 5:52 PM

## 2020-01-01 ENCOUNTER — Encounter: Payer: Medicaid Other | Admitting: Family Medicine

## 2020-01-06 ENCOUNTER — Telehealth: Payer: Self-pay

## 2020-01-06 NOTE — Telephone Encounter (Signed)
Pt called the nurse after hours line at 2:20am stating she is having vaginal discharge, vaginal burning, and itching x 1-2 days.   Called pt and left a message for pt to call the office back. Chikita Dogan l Lavena Loretto, CMA

## 2020-01-07 ENCOUNTER — Ambulatory Visit (INDEPENDENT_AMBULATORY_CARE_PROVIDER_SITE_OTHER): Payer: Medicaid Other | Admitting: Family Medicine

## 2020-01-07 ENCOUNTER — Other Ambulatory Visit (HOSPITAL_COMMUNITY)
Admission: RE | Admit: 2020-01-07 | Discharge: 2020-01-07 | Disposition: A | Discharge status: Home / Self Care | Payer: Medicaid Other | Source: Ambulatory Visit | Attending: Family Medicine | Admitting: Family Medicine

## 2020-01-07 ENCOUNTER — Other Ambulatory Visit: Payer: Self-pay

## 2020-01-07 VITALS — BP 115/74 | HR 127 | Wt 163.0 lb

## 2020-01-07 DIAGNOSIS — Z3480 Encounter for supervision of other normal pregnancy, unspecified trimester: Secondary | ICD-10-CM | POA: Diagnosis not present

## 2020-01-07 DIAGNOSIS — Z3483 Encounter for supervision of other normal pregnancy, third trimester: Secondary | ICD-10-CM

## 2020-01-07 DIAGNOSIS — Z98891 History of uterine scar from previous surgery: Secondary | ICD-10-CM

## 2020-01-07 DIAGNOSIS — Z3A39 39 weeks gestation of pregnancy: Secondary | ICD-10-CM

## 2020-01-07 DIAGNOSIS — Z8619 Personal history of other infectious and parasitic diseases: Secondary | ICD-10-CM

## 2020-01-07 NOTE — Progress Notes (Signed)
Patient having clear discharge. Patient has missed last two appointment and needs ob cultures done. Patient would also like to discuss her repeat c-section. Armandina Stammer RN

## 2020-01-07 NOTE — Progress Notes (Signed)
   PRENATAL VISIT NOTE  Subjective:  Christina Woodard is a 29 y.o. G3P1011 at [redacted]w[redacted]d being seen today for ongoing prenatal care.  She is currently monitored for the following issues for this low-risk pregnancy and has Supervision of other normal pregnancy, antepartum; History of cesarean delivery; and History of syphilis on their problem list.  Patient reports occasional contractions.  Contractions: Not present. Vag. Bleeding: Small, Other.  Movement: Present. Denies leaking of fluid.   The following portions of the patient's history were reviewed and updated as appropriate: allergies, current medications, past family history, past medical history, past social history, past surgical history and problem list.   Objective:   Vitals:   01/07/20 1447  BP: 115/74  Pulse: (!) 127  Weight: 163 lb (73.9 kg)    Fetal Status: Fetal Heart Rate (bpm): 145 Fundal Height: 39 cm Movement: Present  Presentation: Vertex  General:  Alert, oriented and cooperative. Patient is in no acute distress.  Skin: Skin is warm and dry. No rash noted.   Cardiovascular: Normal heart rate noted  Respiratory: Normal respiratory effort, no problems with respiration noted  Abdomen: Soft, gravid, appropriate for gestational age.  Pain/Pressure: Present     Pelvic: Cervical exam performed in the presence of a chaperone Dilation: 1 Effacement (%): 50 Station: -2  Extremities: Normal range of motion.  Edema: Trace  Mental Status: Normal mood and affect. Normal behavior. Normal judgment and thought content.   Assessment and Plan:  Pregnancy: G3P1011 at [redacted]w[redacted]d 1. Supervision of other normal pregnancy, antepartum FHT and FH normal - GC/Chlamydia probe amp ([place])not at [facility] - Culture, beta strep (group b only)  2. History of cesarean delivery Would like c/s scheduled. If goes into labor, then okay, but otherwise would like RLTCS.  3. History of syphilis  Term labor symptoms and general obstetric precautions  including but not limited to vaginal bleeding, contractions, leaking of fluid and fetal movement were reviewed in detail with the patient. Please refer to After Visit Summary for other counseling recommendations.   No follow-ups on file.  No future appointments.  Levie Heritage, DO

## 2020-01-08 ENCOUNTER — Other Ambulatory Visit: Payer: Self-pay | Admitting: Family Medicine

## 2020-01-10 ENCOUNTER — Encounter (HOSPITAL_COMMUNITY): Payer: Self-pay | Admitting: Family Medicine

## 2020-01-10 ENCOUNTER — Inpatient Hospital Stay (HOSPITAL_COMMUNITY)
Admission: AD | Admit: 2020-01-10 | Discharge: 2020-01-13 | DRG: 788 | Disposition: A | Discharge status: Home / Self Care | Payer: Medicaid Other | Attending: Family Medicine | Admitting: Family Medicine

## 2020-01-10 ENCOUNTER — Inpatient Hospital Stay (HOSPITAL_COMMUNITY): Payer: Medicaid Other | Admitting: Anesthesiology

## 2020-01-10 ENCOUNTER — Encounter (HOSPITAL_COMMUNITY)
Admission: AD | Disposition: A | Discharge status: Home / Self Care | Payer: Self-pay | Source: Home / Self Care | Attending: Family Medicine

## 2020-01-10 ENCOUNTER — Inpatient Hospital Stay (HOSPITAL_COMMUNITY): Admission: RE | Admit: 2020-01-10 | Payer: Medicaid Other | Source: Home / Self Care | Admitting: Family Medicine

## 2020-01-10 ENCOUNTER — Other Ambulatory Visit: Payer: Self-pay

## 2020-01-10 DIAGNOSIS — O34211 Maternal care for low transverse scar from previous cesarean delivery: Principal | ICD-10-CM | POA: Diagnosis present

## 2020-01-10 DIAGNOSIS — O26893 Other specified pregnancy related conditions, third trimester: Secondary | ICD-10-CM | POA: Diagnosis present

## 2020-01-10 DIAGNOSIS — Z3A39 39 weeks gestation of pregnancy: Secondary | ICD-10-CM

## 2020-01-10 DIAGNOSIS — F17210 Nicotine dependence, cigarettes, uncomplicated: Secondary | ICD-10-CM | POA: Diagnosis present

## 2020-01-10 DIAGNOSIS — Z98891 History of uterine scar from previous surgery: Secondary | ICD-10-CM

## 2020-01-10 DIAGNOSIS — O99334 Smoking (tobacco) complicating childbirth: Secondary | ICD-10-CM | POA: Diagnosis present

## 2020-01-10 DIAGNOSIS — Z20822 Contact with and (suspected) exposure to covid-19: Secondary | ICD-10-CM | POA: Diagnosis present

## 2020-01-10 DIAGNOSIS — R55 Syncope and collapse: Secondary | ICD-10-CM | POA: Diagnosis not present

## 2020-01-10 DIAGNOSIS — O98113 Syphilis complicating pregnancy, third trimester: Secondary | ICD-10-CM | POA: Diagnosis not present

## 2020-01-10 DIAGNOSIS — O99893 Other specified diseases and conditions complicating puerperium: Secondary | ICD-10-CM | POA: Diagnosis not present

## 2020-01-10 DIAGNOSIS — Z8619 Personal history of other infectious and parasitic diseases: Secondary | ICD-10-CM | POA: Diagnosis present

## 2020-01-10 HISTORY — DX: History of uterine scar from previous surgery: Z98.891

## 2020-01-10 LAB — CBC
HCT: 36.6 % (ref 36.0–46.0)
Hemoglobin: 11.9 g/dL — ABNORMAL LOW (ref 12.0–15.0)
MCH: 29.3 pg (ref 26.0–34.0)
MCHC: 32.5 g/dL (ref 30.0–36.0)
MCV: 90.1 fL (ref 80.0–100.0)
Platelets: 264 10*3/uL (ref 150–400)
RBC: 4.06 MIL/uL (ref 3.87–5.11)
RDW: 12.4 % (ref 11.5–15.5)
WBC: 10.2 10*3/uL (ref 4.0–10.5)
nRBC: 0 % (ref 0.0–0.2)

## 2020-01-10 LAB — TYPE AND SCREEN
ABO/RH(D): O POS
Antibody Screen: NEGATIVE

## 2020-01-10 LAB — RPR
RPR Ser Ql: REACTIVE — AB
RPR Titer: 1:1 {titer}

## 2020-01-10 LAB — SARS CORONAVIRUS 2 BY RT PCR (HOSPITAL ORDER, PERFORMED IN ~~LOC~~ HOSPITAL LAB): SARS Coronavirus 2: NEGATIVE

## 2020-01-10 LAB — ABO/RH: ABO/RH(D): O POS

## 2020-01-10 SURGERY — Surgical Case
Anesthesia: Spinal

## 2020-01-10 MED ORDER — FAMOTIDINE IN NACL 20-0.9 MG/50ML-% IV SOLN
20.0000 mg | Freq: Once | INTRAVENOUS | Status: AC
  Administered 2020-01-10: 20 mg via INTRAVENOUS
  Filled 2020-01-10: qty 50

## 2020-01-10 MED ORDER — ONDANSETRON HCL 4 MG/2ML IJ SOLN
INTRAMUSCULAR | Status: DC | PRN
  Administered 2020-01-10: 4 mg via INTRAVENOUS

## 2020-01-10 MED ORDER — WITCH HAZEL-GLYCERIN EX PADS: 1.0000 "application " | MEDICATED_PAD | CUTANEOUS | Status: DC | PRN

## 2020-01-10 MED ORDER — PRENATAL MULTIVITAMIN CH
1.0000 | ORAL_TABLET | Freq: Every day | ORAL | Status: DC
  Administered 2020-01-10 – 2020-01-13 (×4): 1 via ORAL
  Filled 2020-01-10 (×4): qty 1

## 2020-01-10 MED ORDER — SENNOSIDES-DOCUSATE SODIUM 8.6-50 MG PO TABS
2.0000 | ORAL_TABLET | ORAL | Status: DC
  Administered 2020-01-10 – 2020-01-11 (×2): 2 via ORAL
  Filled 2020-01-10 (×3): qty 2

## 2020-01-10 MED ORDER — LACTATED RINGERS IV BOLUS
1000.0000 mL | Freq: Once | INTRAVENOUS | Status: AC
  Administered 2020-01-10: 1000 mL via INTRAVENOUS

## 2020-01-10 MED ORDER — SOD CITRATE-CITRIC ACID 500-334 MG/5ML PO SOLN
30.0000 mL | Freq: Once | ORAL | Status: AC
  Administered 2020-01-10: 30 mL via ORAL
  Filled 2020-01-10: qty 30

## 2020-01-10 MED ORDER — KETOROLAC TROMETHAMINE 30 MG/ML IJ SOLN
30.0000 mg | Freq: Four times a day (QID) | INTRAMUSCULAR | Status: AC
  Administered 2020-01-10 – 2020-01-11 (×3): 30 mg via INTRAVENOUS
  Filled 2020-01-10 (×3): qty 1

## 2020-01-10 MED ORDER — SIMETHICONE 80 MG PO CHEW: 80.0000 mg | CHEWABLE_TABLET | ORAL | Status: DC | PRN

## 2020-01-10 MED ORDER — OXYTOCIN-SODIUM CHLORIDE 30-0.9 UT/500ML-% IV SOLN
INTRAVENOUS | Status: DC | PRN
  Administered 2020-01-10: 30 [IU] via INTRAVENOUS

## 2020-01-10 MED ORDER — CEFAZOLIN SODIUM-DEXTROSE 2-4 GM/100ML-% IV SOLN
2.0000 g | INTRAVENOUS | Status: AC
  Administered 2020-01-10: 2 g via INTRAVENOUS
  Filled 2020-01-10: qty 100

## 2020-01-10 MED ORDER — MORPHINE SULFATE (PF) 0.5 MG/ML IJ SOLN
INTRAMUSCULAR | Status: DC | PRN
  Administered 2020-01-10: .15 mg via INTRATHECAL

## 2020-01-10 MED ORDER — LACTATED RINGERS IV SOLN: INTRAVENOUS | Status: DC | PRN

## 2020-01-10 MED ORDER — FENTANYL CITRATE (PF) 100 MCG/2ML IJ SOLN
100.0000 ug | Freq: Once | INTRAMUSCULAR | Status: AC
  Administered 2020-01-10: 100 ug via INTRAVENOUS
  Filled 2020-01-10: qty 2

## 2020-01-10 MED ORDER — LACTATED RINGERS IV SOLN
125.0000 mL/h | INTRAVENOUS | Status: DC
  Administered 2020-01-10 (×3): 125 mL/h via INTRAVENOUS

## 2020-01-10 MED ORDER — DIPHENHYDRAMINE HCL 50 MG/ML IJ SOLN
INTRAMUSCULAR | Status: AC
  Filled 2020-01-10: qty 1

## 2020-01-10 MED ORDER — FENTANYL CITRATE (PF) 100 MCG/2ML IJ SOLN
INTRAMUSCULAR | Status: DC | PRN
  Administered 2020-01-10: 15 ug via INTRATHECAL

## 2020-01-10 MED ORDER — DIBUCAINE (PERIANAL) 1 % EX OINT: 1.0000 "application " | TOPICAL_OINTMENT | CUTANEOUS | Status: DC | PRN

## 2020-01-10 MED ORDER — MEPERIDINE HCL 25 MG/ML IJ SOLN: 6.2500 mg | INTRAMUSCULAR | Status: DC | PRN

## 2020-01-10 MED ORDER — FENTANYL CITRATE (PF) 100 MCG/2ML IJ SOLN: 25.0000 ug | INTRAMUSCULAR | Status: DC | PRN

## 2020-01-10 MED ORDER — OXYCODONE-ACETAMINOPHEN 5-325 MG PO TABS
1.0000 | ORAL_TABLET | ORAL | Status: DC | PRN
  Administered 2020-01-11: 1 via ORAL
  Filled 2020-01-10: qty 1

## 2020-01-10 MED ORDER — MENTHOL 3 MG MT LOZG: 1.0000 | LOZENGE | OROMUCOSAL | Status: DC | PRN

## 2020-01-10 MED ORDER — SIMETHICONE 80 MG PO CHEW
80.0000 mg | CHEWABLE_TABLET | ORAL | Status: DC
  Administered 2020-01-10 – 2020-01-13 (×3): 80 mg via ORAL
  Filled 2020-01-10 (×4): qty 1

## 2020-01-10 MED ORDER — SIMETHICONE 80 MG PO CHEW
80.0000 mg | CHEWABLE_TABLET | Freq: Three times a day (TID) | ORAL | Status: DC
  Administered 2020-01-10 – 2020-01-13 (×9): 80 mg via ORAL
  Filled 2020-01-10 (×8): qty 1

## 2020-01-10 MED ORDER — SCOPOLAMINE 1 MG/3DAYS TD PT72
1.0000 | MEDICATED_PATCH | Freq: Once | TRANSDERMAL | Status: DC
  Administered 2020-01-10: 1.5 mg via TRANSDERMAL
  Filled 2020-01-10: qty 1

## 2020-01-10 MED ORDER — TETANUS-DIPHTH-ACELL PERTUSSIS 5-2.5-18.5 LF-MCG/0.5 IM SUSP: 0.5000 mL | Freq: Once | INTRAMUSCULAR | Status: DC

## 2020-01-10 MED ORDER — IBUPROFEN 800 MG PO TABS
800.0000 mg | ORAL_TABLET | Freq: Four times a day (QID) | ORAL | Status: DC
  Administered 2020-01-11 – 2020-01-13 (×9): 800 mg via ORAL
  Filled 2020-01-10 (×9): qty 1

## 2020-01-10 MED ORDER — KETOROLAC TROMETHAMINE 30 MG/ML IJ SOLN
INTRAMUSCULAR | Status: DC | PRN
  Administered 2020-01-10: 30 mg via INTRAVENOUS

## 2020-01-10 MED ORDER — BUPIVACAINE IN DEXTROSE 0.75-8.25 % IT SOLN
INTRATHECAL | Status: DC | PRN
  Administered 2020-01-10: 1 mL via INTRATHECAL
  Administered 2020-01-10: 1.6 mL via INTRATHECAL

## 2020-01-10 MED ORDER — OXYTOCIN-SODIUM CHLORIDE 30-0.9 UT/500ML-% IV SOLN: 2.5000 [IU]/h | INTRAVENOUS | Status: AC

## 2020-01-10 MED ORDER — LACTATED RINGERS IV SOLN: INTRAVENOUS | Status: DC

## 2020-01-10 MED ORDER — COCONUT OIL OIL: 1.0000 "application " | TOPICAL_OIL | Status: DC | PRN

## 2020-01-10 MED ORDER — DEXAMETHASONE SODIUM PHOSPHATE 10 MG/ML IJ SOLN
INTRAMUSCULAR | Status: DC | PRN
  Administered 2020-01-10: 10 mg via INTRAVENOUS

## 2020-01-10 MED ORDER — DIPHENHYDRAMINE HCL 25 MG PO CAPS
25.0000 mg | ORAL_CAPSULE | Freq: Four times a day (QID) | ORAL | Status: DC | PRN
  Administered 2020-01-11: 25 mg via ORAL
  Filled 2020-01-10: qty 1

## 2020-01-10 MED ORDER — FENTANYL CITRATE (PF) 100 MCG/2ML IJ SOLN
INTRAMUSCULAR | Status: DC | PRN
  Administered 2020-01-10: 85 ug via INTRAVENOUS

## 2020-01-10 MED ORDER — DIPHENHYDRAMINE HCL 50 MG/ML IJ SOLN
25.0000 mg | Freq: Once | INTRAMUSCULAR | Status: AC
  Administered 2020-01-10: 25 mg via INTRAVENOUS

## 2020-01-10 MED ORDER — ZOLPIDEM TARTRATE 5 MG PO TABS: 5.0000 mg | ORAL_TABLET | Freq: Every evening | ORAL | Status: DC | PRN

## 2020-01-10 MED ORDER — PHENYLEPHRINE HCL-NACL 20-0.9 MG/250ML-% IV SOLN
INTRAVENOUS | Status: DC | PRN
  Administered 2020-01-10: 60 ug/min via INTRAVENOUS

## 2020-01-10 SURGICAL SUPPLY — 32 items
APL SKNCLS STERI-STRIP NONHPOA (GAUZE/BANDAGES/DRESSINGS) ×1
BENZOIN TINCTURE PRP APPL 2/3 (GAUZE/BANDAGES/DRESSINGS) ×2 IMPLANT
CHLORAPREP W/TINT 26ML (MISCELLANEOUS) ×2 IMPLANT
CLAMP CORD UMBIL (MISCELLANEOUS) IMPLANT
CLOTH BEACON ORANGE TIMEOUT ST (SAFETY) ×2 IMPLANT
DRSG OPSITE POSTOP 4X10 (GAUZE/BANDAGES/DRESSINGS) ×2 IMPLANT
ELECT REM PT RETURN 9FT ADLT (ELECTROSURGICAL) ×2
ELECTRODE REM PT RTRN 9FT ADLT (ELECTROSURGICAL) ×1 IMPLANT
EXTRACTOR VACUUM M CUP 4 TUBE (SUCTIONS) IMPLANT
GLOVE BIOGEL PI IND STRL 7.0 (GLOVE) ×2 IMPLANT
GLOVE BIOGEL PI IND STRL 7.5 (GLOVE) ×2 IMPLANT
GLOVE BIOGEL PI INDICATOR 7.0 (GLOVE) ×2
GLOVE BIOGEL PI INDICATOR 7.5 (GLOVE) ×2
GLOVE ECLIPSE 7.5 STRL STRAW (GLOVE) ×2 IMPLANT
GOWN STRL REUS W/TWL LRG LVL3 (GOWN DISPOSABLE) ×6 IMPLANT
KIT ABG SYR 3ML LUER SLIP (SYRINGE) IMPLANT
NDL HYPO 25X5/8 SAFETYGLIDE (NEEDLE) IMPLANT
NEEDLE HYPO 25X5/8 SAFETYGLIDE (NEEDLE) IMPLANT
NS IRRIG 1000ML POUR BTL (IV SOLUTION) ×2 IMPLANT
PACK C SECTION WH (CUSTOM PROCEDURE TRAY) ×2 IMPLANT
PAD OB MATERNITY 4.3X12.25 (PERSONAL CARE ITEMS) ×2 IMPLANT
PENCIL SMOKE EVAC W/HOLSTER (ELECTROSURGICAL) ×2 IMPLANT
RTRCTR C-SECT PINK 25CM LRG (MISCELLANEOUS) ×2 IMPLANT
STRIP CLOSURE SKIN 1/2X4 (GAUZE/BANDAGES/DRESSINGS) ×2 IMPLANT
SUT VIC AB 0 CTX 36 (SUTURE) ×6
SUT VIC AB 0 CTX36XBRD ANBCTRL (SUTURE) ×3 IMPLANT
SUT VIC AB 2-0 CT1 27 (SUTURE) ×2
SUT VIC AB 2-0 CT1 TAPERPNT 27 (SUTURE) ×1 IMPLANT
SUT VIC AB 4-0 KS 27 (SUTURE) ×2 IMPLANT
TOWEL OR 17X24 6PK STRL BLUE (TOWEL DISPOSABLE) ×2 IMPLANT
TRAY FOLEY W/BAG SLVR 14FR LF (SET/KITS/TRAYS/PACK) ×2 IMPLANT
WATER STERILE IRR 1000ML POUR (IV SOLUTION) ×2 IMPLANT

## 2020-01-10 NOTE — Anesthesia Procedure Notes (Addendum)
Spinal  Patient location during procedure: OR Start time: 01/10/2020 10:23 AM End time: 01/10/2020 10:27 AM Staffing Performed: anesthesiologist  Anesthesiologist: Mal Amabile, MD Preanesthetic Checklist Completed: patient identified, IV checked, site marked, risks and benefits discussed, surgical consent, monitors and equipment checked, pre-op evaluation and timeout performed Spinal Block Patient position: sitting Prep: DuraPrep and site prepped and draped Patient monitoring: heart rate, cardiac monitor, continuous pulse ox and blood pressure Approach: midline Location: L3-4 Injection technique: single-shot Needle Needle type: Pencan  Needle gauge: 24 G Needle length: 9 cm Needle insertion depth: 6 cm Assessment Sensory level: T4 Additional Notes Patient tolerated procedure well. Adequate sensory level. Transient paresthesia right leg.

## 2020-01-10 NOTE — Progress Notes (Signed)
Pt honeycomb 75% saturated. RN called CNM for order to change dressing. Elam Dutch

## 2020-01-10 NOTE — Anesthesia Procedure Notes (Signed)
Spinal  Patient location during procedure: OR Start time: 01/10/2020 10:40 AM End time: 01/10/2020 10:45 AM Staffing Performed: anesthesiologist  Anesthesiologist: Mal Amabile, MD Preanesthetic Checklist Completed: patient identified, IV checked, site marked, risks and benefits discussed, surgical consent, monitors and equipment checked, pre-op evaluation and timeout performed Spinal Block Patient position: sitting Prep: DuraPrep and site prepped and draped Patient monitoring: heart rate, cardiac monitor, continuous pulse ox and blood pressure Approach: midline Location: L3-4 Injection technique: single-shot Needle Needle type: Sprotte and Pencan  Needle gauge: 24 G Needle length: 9 cm Needle insertion depth: 6 cm Assessment Sensory level: T4 Additional Notes Patient tolerated procedure well. Adequate sensory level.

## 2020-01-10 NOTE — Anesthesia Postprocedure Evaluation (Signed)
Anesthesia Post Note  Patient: Christina Woodard  Procedure(s) Performed: CESAREAN SECTION (N/A )     Patient location during evaluation: PACU Anesthesia Type: Spinal Level of consciousness: oriented and awake and alert Pain management: pain level controlled Vital Signs Assessment: post-procedure vital signs reviewed and stable Respiratory status: spontaneous breathing, respiratory function stable and patient connected to nasal cannula oxygen Cardiovascular status: blood pressure returned to baseline and stable Postop Assessment: no headache, no backache and no apparent nausea or vomiting Anesthetic complications: no   No complications documented.  Last Vitals:  Vitals:   01/10/20 1300 01/10/20 1324  BP: 113/85 114/66  Pulse: 72 65  Resp: 20 18  Temp: 36.6 C 36.8 C  SpO2: 99% 98%    Last Pain:  Vitals:   01/10/20 1324  TempSrc: Oral  PainSc: 0-No pain   Pain Goal: Patients Stated Pain Goal: 0 (01/10/20 0857)                 [name] A.

## 2020-01-10 NOTE — Op Note (Signed)
Christina Woodard PROCEDURE DATE: 01/10/2020  PREOPERATIVE DIAGNOSIS: Intrauterine pregnancy at  [redacted]w[redacted]d weeks gestation; elective repeat, labor  POSTOPERATIVE DIAGNOSIS: The same  PROCEDURE: Repeat Low Transverse Cesarean Section  SURGEON:  Dr. Candelaria Celeste  ASSISTANT: Mamie Levers, RN, RNFA  INDICATIONS: Christina Woodard is a 29 y.o. G3P1011 at [redacted]w[redacted]d scheduled for cesarean section secondary to elective repeat, labor.  The risks of cesarean section discussed with the patient included but were not limited to: bleeding which may require transfusion or reoperation; infection which may require antibiotics; injury to bowel, bladder, ureters or other surrounding organs; injury to the fetus; need for additional procedures including hysterectomy in the event of a life-threatening hemorrhage; placental abnormalities wth subsequent pregnancies, incisional problems, thromboembolic phenomenon and other postoperative/anesthesia complications. The patient concurred with the proposed plan, giving informed written consent for the procedure.    FINDINGS:  Viable female infant in vertex, OP presentation.  Apgars 8 and 9, weight, 6 pounds and 7.5 ounces.  Thick meconium stained amniotic fluid.  Intact placenta, three vessel cord.  Normal uterus, fallopian tubes and ovaries bilaterally.  ANESTHESIA:    Spinal INTRAVENOUS FLUIDS: 1200 ml ESTIMATED BLOOD LOSS: 415 ml URINE OUTPUT:  50 ml SPECIMENS: Placenta sent to L&D COMPLICATIONS: None immediate  PROCEDURE IN DETAIL:  The patient received intravenous antibiotics and had sequential compression devices applied to her lower extremities while in the preoperative area.  She was then taken to the operating room where spinal anesthesia was administered (epidural anesthesia was dosed up to surgical level) and was found to be adequate. She was then placed in a dorsal supine position with a leftward tilt, and prepped and draped in a sterile manner.  A foley catheter was  placed into her bladder and attached to constant gravity, which drained clear fluid throughout.  After an adequate timeout was performed, a Pfannenstiel skin incision was made with scalpel and carried through to the underlying layer of fascia. The fascia was incised in the midline and this incision was extended bilaterally using the Mayo scissors. Kocher clamps were applied to the superior aspect of the fascial incision and the underlying rectus muscles were dissected off bluntly. A similar process was carried out on the inferior aspect of the facial incision. The rectus muscles were separated in the midline bluntly and the peritoneum was entered bluntly. An Alexis retractor was placed to aid in visualization of the uterus.  Attention was turned to the lower uterine segment where a transverse hysterotomy was made with a scalpel and extended bilaterally bluntly. Thick meconium stained fluid and baby was in occiput posterior position. The infant was successfully delivered, and cord was clamped and cut and infant was handed over to awaiting neonatology team. Uterine massage was then administered and the placenta delivered intact with three-vessel cord. The uterus was then cleared of clot and debris.  The hysterotomy was closed with 0 Vicryl in a running locked fashion, and an imbricating layer was also placed with a 0 Vicryl. Overall, excellent hemostasis was noted. The abdomen and the pelvis were cleared of all clot and debris and the Jon Gills was removed. Hemostasis was confirmed on all surfaces.  The peritoneum was reapproximated using 2-0 vicryl running stitches. The fascia was then closed using 0 Vicryl in a running fashion. The skin was closed with 4-0 vicryl. The patient tolerated the procedure well. Sponge, lap, instrument and needle counts were correct x 2. She was taken to the recovery room in stable condition.    Candelaria Celeste  J, DO 01/10/2020 11:18 AM

## 2020-01-10 NOTE — Anesthesia Preprocedure Evaluation (Signed)
Anesthesia Evaluation  Patient identified by MRN, date of birth, ID band Patient awake    Reviewed: Allergy & Precautions, NPO status , Patient's Chart, lab work & pertinent test results  Airway Mallampati: II  TM Distance: >3 FB Neck ROM: Full    Dental no notable dental hx. (+) Teeth Intact   Pulmonary Current Smoker,    Pulmonary exam normal breath sounds clear to auscultation       Cardiovascular negative cardio ROS Normal cardiovascular exam Rhythm:Regular Rate:Normal     Neuro/Psych negative neurological ROS  negative psych ROS   GI/Hepatic Neg liver ROS, GERD  ,  Endo/Other  negative endocrine ROS  Renal/GU   negative genitourinary   Musculoskeletal negative musculoskeletal ROS (+)   Abdominal   Peds  Hematology negative hematology ROS (+)   Anesthesia Other Findings   Reproductive/Obstetrics (+) Pregnancy Previous C/Section Hx/o Syphilis - treated                             Anesthesia Physical Anesthesia Plan  ASA: II  Anesthesia Plan: Spinal   Post-op Pain Management:    Induction:   PONV Risk Score and Plan: 3 and Scopolamine patch - Pre-op  Airway Management Planned: Natural Airway  Additional Equipment:   Intra-op Plan:   Post-operative Plan:   Informed Consent: I have reviewed the patients History and Physical, chart, labs and discussed the procedure including the risks, benefits and alternatives for the proposed anesthesia with the patient or authorized representative who has indicated his/her understanding and acceptance.     Dental advisory given  Plan Discussed with: CRNA and Anesthesiologist  Anesthesia Plan Comments:         Anesthesia Quick Evaluation

## 2020-01-10 NOTE — Transfer of Care (Signed)
Immediate Anesthesia Transfer of Care Note  Patient: Christina Woodard  Procedure(s) Performed: CESAREAN SECTION (N/A )  Patient Location: PACU  Anesthesia Type:Spinal  Level of Consciousness: awake and alert   Airway & Oxygen Therapy: Patient Spontanous Breathing  Post-op Assessment: Report given to RN and Post -op Vital signs reviewed and stable  Post vital signs: Reviewed and stable  Last Vitals:  Vitals Value Taken Time  BP 100/66 01/10/20 1132  Temp    Pulse 78 01/10/20 1135  Resp 18 01/10/20 1134  SpO2 99 % 01/10/20 1135  Vitals shown include unvalidated device data.  Last Pain:  Vitals:   01/10/20 0857  TempSrc:   PainSc: 8       Patients Stated Pain Goal: 0 (01/10/20 0857)  Complications: No complications documented.

## 2020-01-10 NOTE — Addendum Note (Signed)
Addendum  created 01/10/20 1431 by Mal Amabile, MD   Attestation recorded in Intraprocedure, Intraprocedure Attestations filed

## 2020-01-10 NOTE — MAU Provider Note (Signed)
See H& P.

## 2020-01-10 NOTE — MAU Note (Signed)
Pt requesting to proceed with repeat section today. Dr Adrian Blackwater made aware. Pt to be prepped for OR in MAU since she does not have labs and covid swab resulted in chart.

## 2020-01-10 NOTE — MAU Note (Signed)
Christina Woodard is a 29 y.o. at [redacted]w[redacted]d here in MAU reporting: contractions since 0530, they started getting closer, now they are every 3 minutes. No bleeding, had a small amount of fluid leak out, states it was white and yellow. DFM.  Onset of complaint: today  Pain score: 8/10  Vitals:   01/10/20 0822  BP: 106/74  Pulse: (!) 113  Resp: 16  Temp: 98.1 F (36.7 C)  SpO2: 95%     FHT: 152  Lab orders placed from triage: none

## 2020-01-10 NOTE — H&P (Signed)
Faculty Practice H&P  ROMAINE MACIOLEK is a 29 y.o. female G3P1011 with IUP at [redacted]w[redacted]d presenting for contractions. She was scheduled for repeat cesarean section today for elective repeat. Pregnancy was been complicated by previous c/s, positive syphilis this pregnancy with treatment and subsequent negative test.    Pt states she has been having contractions every 4-5 minutes, no vaginal bleeding, intact membranes, with normal fetal movement.     Prenatal Course Source of Care: [facility] with onset of care at 22weeks  Pregnancy complications or risks: Patient Active Problem List   Diagnosis Date Noted  . Supervision of other normal pregnancy, antepartum 09/11/2019  . History of cesarean delivery 09/11/2019  . History of syphilis 09/11/2019   She desires condoms for contraception.  She plans to breastfeed, plans to bottle feed  Prenatal labs and studies: ABO, Rh: O/Positive/-- (03/05 1006) Antibody: Negative (03/05 1006) Rubella: 8.86 (03/05 1006) RPR: Reactive (04/22 0947)  HBsAg: Negative (03/05 1006)  HIV: Non Reactive (04/22 0947)  GBS:    2hr Glucola: negative Genetic screening: normal Anatomy US: normal  Past Medical History:  Past Medical History:  Diagnosis Date  . Medical history non-contributory   . Syphilis     Past Surgical History:  Past Surgical History:  Procedure Laterality Date  . CESAREAN SECTION    . CESAREAN SECTION      Obstetrical History:  OB History    Gravida  3   Para  1   Term  1   Preterm      AB  1   Living  1     SAB  1   TAB      Ectopic      Multiple      Live Births  1           Gynecological History:  OB History    Gravida  3   Para  1   Term  1   Preterm      AB  1   Living  1     SAB  1   TAB      Ectopic      Multiple      Live Births  1           Social History:  Social History   Socioeconomic History  . Marital status: Single    Spouse name: Not on file  . Number of  children: Not on file  . Years of education: Not on file  . Highest education level: Not on file  Occupational History  . Not on file  Tobacco Use  . Smoking status: Current Every Day Smoker    Packs/day: 1.00    Types: Cigarettes  . Smokeless tobacco: Never Used  Vaping Use  . Vaping Use: Never assessed  Substance and Sexual Activity  . Alcohol use: Yes    Comment: occ  . Drug use: Yes    Types: Marijuana    Comment: last smoked January 2021  . Sexual activity: Yes    Birth control/protection: None  Other Topics Concern  . Not on file  Social History Narrative  . Not on file   Social Determinants of Health   Financial Resource Strain:   . Difficulty of Paying Living Expenses:   Food Insecurity:   . Worried About Programme researcher, broadcasting/film/video in the Last Year:   . [employer] of Food in the Last Year:   Transportation Needs:   . Lack of  Transportation (Medical):   Marland Kitchen Lack of Transportation (Non-Medical):   Physical Activity:   . Days of Exercise per Week:   . Minutes of Exercise per Session:   Stress:   . Feeling of Stress :   Social Connections:   . Frequency of Communication with Friends and Family:   . Frequency of Social Gatherings with Friends and Family:   . Attends Religious Services:   . Active Member of Clubs or Organizations:   . Attends Banker Meetings:   Marland Kitchen Marital Status:     Family History:  Family History  Problem Relation Age of Onset  . Healthy Mother   . Healthy Father     Medications:  Prenatal vitamins,  Current Facility-Administered Medications  Medication Dose Route Frequency Provider Last Rate Last Admin  . ceFAZolin (ANCEF) IVPB 2g/100 mL premix  2 g Intravenous On Call to OR Levie Heritage, DO      . famotidine (PEPCID) IVPB 20 mg premix  20 mg Intravenous Once Mal Amabile, MD 100 mL/hr at 01/10/20 0901 20 mg at 01/10/20 0901  . lactated ringers infusion  125 mL/hr Intravenous Continuous Levie Heritage, DO 125 mL/hr at  01/10/20 0839 125 mL/hr at 01/10/20 0839  . scopolamine (TRANSDERM-SCOP) 1 MG/3DAYS 1.5 mg  1 patch Transdermal Once Mal Amabile, MD   1.5 mg at 01/10/20 0904  . sodium citrate-citric acid (ORACIT) solution 30 mL  30 mL Oral Once Mal Amabile, MD        Allergies: No Known Allergies  Review of Systems: - negative  Physical Exam: Blood pressure 106/74, pulse (!) 113, temperature 98.1 F (36.7 C), temperature source Oral, resp. rate 16, last menstrual period 04/09/2019, SpO2 95 %. GENERAL: Well-developed, well-nourished female in no acute distress.  LUNGS: Clear to auscultation bilaterally.  HEART: Regular rate and rhythm. ABDOMEN: Soft, nontender, nondistended, gravid. EXTREMITIES: Nontender, no edema, 2+ distal pulses. FHT:  Baseline rate 140 bpm   Variability moderate  Accelerations present   Decelerations none Contractions: Every 3-4 mins   Pertinent Labs/Studies:   Lab Results  Component Value Date   WBC 10.2 01/10/2020   HGB 11.9 (L) 01/10/2020   HCT 36.6 01/10/2020   MCV 90.1 01/10/2020   PLT 264 01/10/2020    Assessment : Imane Burrough Pigman is a 29 y.o. G3P1011 at [redacted]w[redacted]d being admitted for cesarean section secondary to elective repeat, labor  Plan: I offered TOLAC to the patient, but she declined.  The risks of cesarean section discussed with the patient included but were not limited to: bleeding which may require transfusion or reoperation; infection which may require antibiotics; injury to bowel, bladder, ureters or other surrounding organs; injury to the fetus; need for additional procedures including hysterectomy in the event of a life-threatening hemorrhage; placental abnormalities wth subsequent pregnancies, incisional problems, thromboembolic phenomenon and other postoperative/anesthesia complications. The patient concurred with the proposed plan, giving informed written consent for the procedure.   Patient has been NPO since last night and will remain NPO for  procedure.  Preoperative prophylactic Ancef ordered on call to the OR.    Levie Heritage, DO 01/10/2020, 9:16 AM

## 2020-01-11 LAB — CBC
HCT: 29.9 % — ABNORMAL LOW (ref 36.0–46.0)
Hemoglobin: 9.6 g/dL — ABNORMAL LOW (ref 12.0–15.0)
MCH: 29.5 pg (ref 26.0–34.0)
MCHC: 32.1 g/dL (ref 30.0–36.0)
MCV: 92 fL (ref 80.0–100.0)
Platelets: 228 10*3/uL (ref 150–400)
RBC: 3.25 MIL/uL — ABNORMAL LOW (ref 3.87–5.11)
RDW: 12.4 % (ref 11.5–15.5)
WBC: 11.6 10*3/uL — ABNORMAL HIGH (ref 4.0–10.5)
nRBC: 0 % (ref 0.0–0.2)

## 2020-01-11 LAB — CULTURE, BETA STREP (GROUP B ONLY): Strep Gp B Culture: NEGATIVE

## 2020-01-11 MED ORDER — HYDROXYZINE HCL 25 MG PO TABS
25.0000 mg | ORAL_TABLET | Freq: Three times a day (TID) | ORAL | Status: DC | PRN
  Administered 2020-01-11: 25 mg via ORAL
  Filled 2020-01-11: qty 1

## 2020-01-11 NOTE — Lactation Note (Signed)
This note was copied from a baby's chart. Lactation Consultation Note  Patient Name: Christina Woodard [id number] Date: 01/11/2020 Reason for consult: Initial assessment   P2, Baby 22 hours old.  Baby latched upon entering.  Mother states she would like to breastfeed and formula feed. Discussed supply and demand and reviewed hand expression with good flow. FOB asking questions about "how do you know baby is getting enough?" Reassured with hand expression. Feed on demand with cues.  Goal 8-12+ times per day after first 24 hrs.  Place baby STS if not cueing.  Discussed basics.  Feed on demand with cues.  Goal 8-12+ times per day after first 24 hrs.  Place baby STS if not cueing.    Repositioned baby for more support.  Discussed how infant's breath when bf.  Mom made aware of O/P services, breastfeeding support groups, community resources, and our phone # for post-discharge questions.     Maternal Data Has patient been taught Hand Expression?: Yes Does the patient have breastfeeding experience prior to this delivery?: Yes  Feeding Feeding Type: Breast Fed Nipple Type: Slow - flow  LATCH Score Latch: Grasps breast easily, tongue down, lips flanged, rhythmical sucking.  Audible Swallowing: A few with stimulation  Type of Nipple: Everted at rest and after stimulation  Comfort (Breast/Nipple): Soft / non-tender  Hold (Positioning): Assistance needed to correctly position infant at breast and maintain latch.  LATCH Score: 8  Interventions Interventions: Breast feeding basics reviewed;Assisted with latch;Hand express;Breast compression;Adjust position;Support pillows  Lactation Tools Discussed/Used     Consult Status Consult Status: Follow-up Date: 01/11/20 Follow-up type: In-patient    Dahlia Byes [facility] 01/11/2020, 10:21 AM

## 2020-01-11 NOTE — Progress Notes (Signed)
CNM Clayton Bibles notified of honeycomb being 50% saturated since being changed at 2300. CNM will come assess honeycomb this morning, as well as, Dr. Salomon Mast once she arrives. Elam Dutch

## 2020-01-11 NOTE — Progress Notes (Signed)
Subjective: Postpartum Day 1: Cesarean Delivery Patient reports tolerating PO, + flatus and no problems voiding.    Objective: Vital signs in last 24 hours: Temp:  [94.8 F (34.9 C)-98.7 F (37.1 C)] 98.7 F (37.1 C) (07/05 0503) Pulse Rate:  [54-113] 54 (07/05 0503) Resp:  [15-29] 18 (07/05 0503) BP: (97-116)/(65-90) 97/69 (07/05 0503) SpO2:  [95 %-100 %] 100 % (07/05 0503)  Physical Exam:  General: alert, cooperative, appears stated age and no distress Lochia: appropriate Uterine Fundus: firm Incision: Old dried drainage, marked by RN DVT Evaluation: No evidence of DVT seen on physical exam.  Recent Labs    01/10/20 0840  HGB 11.9*  HCT 36.6    Assessment/Plan: Status post Cesarean section. Doing well postoperatively.  Honeycomb dressing replaced last night around 2300 hours Patient very active in room, discussed ways to reduce abdominal exertion Continue current care.  Calvert Cantor, CNM 01/11/2020, 6:47 AM
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
## 2020-01-12 LAB — GC/CHLAMYDIA PROBE AMP (~~LOC~~) NOT AT ARMC
Chlamydia: NEGATIVE
Comment: NEGATIVE
Comment: NORMAL
Neisseria Gonorrhea: NEGATIVE

## 2020-01-12 LAB — T.PALLIDUM AB, TOTAL: T Pallidum Abs: REACTIVE — AB

## 2020-01-12 MED ORDER — FERROUS SULFATE 325 (65 FE) MG PO TABS
325.0000 mg | ORAL_TABLET | Freq: Every day | ORAL | Status: DC
  Administered 2020-01-12 – 2020-01-13 (×2): 325 mg via ORAL
  Filled 2020-01-12 (×2): qty 1

## 2020-01-12 MED ORDER — POLYETHYLENE GLYCOL 3350 17 G PO PACK
17.0000 g | PACK | Freq: Every day | ORAL | Status: DC
  Administered 2020-01-12 – 2020-01-13 (×2): 17 g via ORAL
  Filled 2020-01-12 (×2): qty 1

## 2020-01-12 MED ORDER — SENNOSIDES-DOCUSATE SODIUM 8.6-50 MG PO TABS: 2.0000 | ORAL_TABLET | ORAL | 0 refills | Status: DC

## 2020-01-12 MED ORDER — OXYCODONE HCL 5 MG PO TABS: 5.0000 mg | ORAL_TABLET | ORAL | 0 refills | Status: DC | PRN

## 2020-01-12 MED ORDER — IBUPROFEN 800 MG PO TABS: 800.0000 mg | ORAL_TABLET | Freq: Four times a day (QID) | ORAL | 0 refills | Status: DC

## 2020-01-12 MED ORDER — POLYETHYLENE GLYCOL 3350 17 G PO PACK: 17.0000 g | PACK | Freq: Every day | ORAL | 0 refills | Status: DC

## 2020-01-12 MED ORDER — FERROUS SULFATE 325 (65 FE) MG PO TABS: 325.0000 mg | ORAL_TABLET | Freq: Every day | ORAL | 0 refills | Status: DC
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
## 2020-01-12 NOTE — Progress Notes (Signed)
Post fall Standard work completed.

## 2020-01-12 NOTE — Progress Notes (Signed)
I spoke with Dr.  Crissie Reese about the need for continued frequent vital signs and neuro checks due to fall this am. Dr Crissie Reese ordered no need for frequent vital sings or frequent neuro checks at this time. Will continue to monitor pt.

## 2020-01-12 NOTE — Progress Notes (Signed)
Pt called out for assistance to bathroom. Entering pt room,  pt found on toilet with no assistance by staff. During rounding at 1500 pt was offered restroom assistance and pt declined assistance at this time. Pt advised to call out for assistance before getting out of bed. Will continue to monitor pt.

## 2020-01-12 NOTE — Lactation Note (Signed)
This note was copied from a baby's chart. Lactation Consultation Note  Patient Name: Christina Woodard Date: 01/12/2020 Reason for consult: Follow-up assessment;Term;Infant weight loss;Other (Comment) (breast are gettng fuller)  Baby is 59 hours old  Per mom last fed at 9:40 - 10 am for 20 mins and wet diaper .  Presently baby asleep on moms chest.  Per mom breast are fuller and warner / not engorged, when latching feeling pinching.  LC offered to assess breast tissue  and mom receptive / noted the breast to be fuller and when  Mom hand expressed the areolas have edema.  LC reassured mom that is normal when the breast are getting fuller. LC recommended shells between feedings except when sleeping   Prior to the 1st latch - breast massage , hand express, pre- pump to stretch the nipple / areola complex  And then reverse pressure as shown if needed.  Sore nipple and engorgement prevention and tx.  LC provided a hand pump and showed mom how to use it . ( # 24 is a good fit. )   LC instructed mom on the use shells and hand pump.   LC recommended and encouraged mom to ask for assistance if needed.  Mom mentioned she had been alternating breast and formula. The formula was causing the baby to be spitty so she stopped with the formula and is just breast feeding.       Maternal Data Has patient been taught Hand Expression?: Yes  Feeding Feeding Type: Breast Fed  LATCH Score                   Interventions Interventions: Breast feeding basics reviewed;Shells;Hand pump  Lactation Tools Discussed/Used Tools: Shells;Pump Shell Type: Inverted Breast pump type: Manual Pump Review: Milk Storage;Setup, frequency, and cleaning Initiated by:: MAI Date initiated:: 01/12/20   Consult Status Consult Status: Follow-up Date: 01/13/20 Follow-up type: In-patient    [name] 01/12/2020, 11:40 AM

## 2020-01-12 NOTE — Discharge Summary (Signed)
Postpartum Discharge Summary     Patient Name: Christina Woodard DOB: [birth year] MRN: [id number]  Date of admission: 01/10/2020 Delivery date:01/10/2020  Delivering provider: Truett Mainland  Date of discharge: 01/12/2020  Admitting diagnosis: Status post cesarean section [Z98.891] Intrauterine pregnancy: [redacted]w[redacted]d    Secondary diagnosis:  Active Problems:   History of cesarean delivery   History of syphilis   Status post cesarean section  Additional problems: None    Discharge diagnosis: Term Pregnancy Delivered                                              Post partum procedures:None Augmentation: N/A Complications: None  Hospital course: Sceduled C/S   29y.o. yo [id number] 39w3das admitted to the hospital 01/10/2020 for scheduled cesarean section with the following indication:Elective Repeat.Delivery details are as follows:  Membrane Rupture Time/Date: 10:58 AM ,01/10/2020   Delivery Method:C-Section, Low Transverse  Details of operation can be found in separate operative note.  Patient had an uncomplicated postpartum course.  She is ambulating, tolerating a regular diet, passing flatus, and urinating well. Patient is discharged home in stable condition on  01/12/20        Newborn Data: Birth date:01/10/2020  Birth time:10:59 AM  Gender:Female  Living status:Living  Apgars:8 ,9  Weight:2935 g     Magnesium Sulfate received: No BMZ received: No Rhophylac:N/A MMR:N/A T-DaP:declined Flu: No Transfusion:No  Physical exam  Vitals:   01/11/20 0503 01/11/20 1501 01/11/20 2141 01/11/20 2147  BP: 97/69 95/66 102/83 113/78  Pulse: (!) 54 79 64 76  Resp: '18 16 15 18  ' Temp: 98.7 F (37.1 C) 97.8 F (36.6 C) 98.4 F (36.9 C) 98.6 F (37 C)  TempSrc: Oral Oral  Oral  SpO2: 100% 99% 100% 100%   General: alert, cooperative and no distress Lochia: appropriate Uterine Fundus: firm Incision: dried blood underneath honeycomb; will change prior to DC DVT Evaluation: No evidence  of DVT seen on physical exam. Labs: Lab Results  Component Value Date   WBC 11.6 (H) 01/11/2020   HGB 9.6 (L) 01/11/2020   HCT 29.9 (L) 01/11/2020   MCV 92.0 01/11/2020   PLT 228 01/11/2020   CMP Latest Ref Rng & Units 08/25/2019  Glucose 70 - 99 mg/dL 86  BUN 6 - 20 mg/dL 7  Creatinine 0.44 - 1.00 mg/dL 0.52  Sodium 135 - 145 mmol/L 133(L)  Potassium 3.5 - 5.1 mmol/L 3.9  Chloride 98 - 111 mmol/L 105  CO2 22 - 32 mmol/L 21(L)  Calcium 8.9 - 10.3 mg/dL 8.5(L)  Total Protein 6.5 - 8.1 g/dL 6.7  Total Bilirubin 0.3 - 1.2 mg/dL 0.4  Alkaline Phos 38 - 126 U/L 51  AST 15 - 41 U/L 13(L)  ALT 0 - 44 U/L 10   Edinburgh Score: Edinburgh Postnatal Depression Scale Screening Tool 01/10/2020  I have been able to laugh and see the funny side of things. 0  I have looked forward with enjoyment to things. 0  I have blamed myself unnecessarily when things went wrong. 1  I have been anxious or worried for no good reason. 1  I have felt scared or panicky for no good reason. 1  Things have been getting on top of me. 1  I have been so unhappy that I have had difficulty sleeping. 1  I have  felt sad or miserable. 1  I have been so unhappy that I have been crying. 0  The thought of harming myself has occurred to me. 0  Edinburgh Postnatal Depression Scale Total 6     After visit meds:  Allergies as of 01/12/2020   No Known Allergies     Medication List    STOP taking these medications   diphenhydramine-acetaminophen 25-500 MG Tabs tablet Commonly known as: TYLENOL PM   liver oil-zinc oxide 40 % ointment Commonly known as: DESITIN   nicotine 14 mg/24hr patch Commonly known as: Nicoderm CQ   omeprazole 20 MG tablet Commonly known as: PRILOSEC OTC     TAKE these medications   ferrous sulfate 325 (65 FE) MG tablet Take 1 tablet (325 mg total) by mouth daily with breakfast.   ibuprofen 800 MG tablet Commonly known as: ADVIL Take 1 tablet (800 mg total) by mouth every 6 (six)  hours.   oxyCODONE 5 MG immediate release tablet Commonly known as: Roxicodone Take 1 tablet (5 mg total) by mouth every 4 (four) hours as needed for moderate pain.   polyethylene glycol 17 g packet Commonly known as: MIRALAX / GLYCOLAX Take 17 g by mouth daily.   senna-docusate 8.6-50 MG tablet Commonly known as: Senokot-S Take 2 tablets by mouth daily. Start taking on: January 13, 2020        Discharge home in stable condition Infant Feeding: Bottle and Breast Infant Disposition:home with mother Discharge instruction: per After Visit Summary and Postpartum booklet. Activity: Advance as tolerated. Pelvic rest for 6 weeks.  Diet: routine diet Future Appointments:No future appointments. Follow up Visit:   Please schedule this patient for a In person postpartum visit in 4 weeks with the following provider: Any provider. Additional Postpartum F/U:Incision check 1 week  Low risk pregnancy complicated by: syphilis treatment Delivery mode:  C-Section, Low Transverse  Anticipated Birth Control:  Condoms   01/12/2020 Chauncey Mann, MD

## 2020-01-12 NOTE — Discharge Instructions (Signed)

## 2020-01-12 NOTE — Progress Notes (Signed)
   01/12/20 1100  What Happened  Was fall witnessed? No  Was patient injured? No  Patient found in bathroom (on the toilet)  Found by Staff-comment (RN entered room to administer scheduled meds)  Stated prior activity bathroom-unassisted  Follow Up  MD notified Crissie Reese  Time MD notified 1100  Additional tests No  Progress note created (see row info) Yes  Adult Fall Risk Assessment  Risk Factor Category (scoring not indicated) Fall has occurred during this admission (document High fall risk)  Age 29  Fall History: Fall within 6 months prior to admission 5  Elimination; Bowel and/or Urine Incontinence 0  Elimination; Bowel and/or Urine Urgency/Frequency 0  Medications: includes PCA/Opiates, Anti-convulsants, Anti-hypertensives, Diuretics, Hypnotics, Laxatives, Sedatives, and Psychotropics 0  Patient Care Equipment 0  Mobility-Assistance 2  Mobility-Gait 2  Mobility-Sensory Deficit 0  Altered awareness of immediate physical environment 0  Impulsiveness 0  Lack of understanding of one's physical/cognitive limitations 0  Total Score 9  Patient Fall Risk Level Moderate fall risk  Adult Fall Risk Interventions  Required Bundle Interventions *See Row Information* Moderate fall risk - low and moderate requirements implemented  Additional Interventions Family Supervision;Room near nurses station;Use of appropriate toileting equipment (bedpan, BSC, etc.);Other (Comment) (instructed to call for help to the bathroom)  Neurological  Neuro (WDL) WDL  Level of Consciousness Alert  Orientation Level Oriented X4  Musculoskeletal  Musculoskeletal (WDL) WDL  Integumentary  Integumentary (WDL) WDL  Skin Integrity Surgical Incision (see LDA)   RN entered patients room to administer scheduled medications. Patient was sitting on the toilet and informed RN that she had just fainted, fell, and hit her head. She stated that she went to the bathroom to try to have a bowel movement and the next thing  she knew her husband was helping her up off the floor. RN notified charge nurse and MD Crissie Reese was called. RN assisted patient back to the bed using a stedy because she stated that she was still dizzy. MD assessed patient and requested that we reassess vitals and do a neuro assessment in 4 hours.   Melvern Banker RN

## 2020-01-13 NOTE — Discharge Summary (Signed)
Postpartum Discharge Summary     Patient Name: Christina Woodard DOB: 1990-12-19 MRN: [id number]  Date of admission: 01/10/2020 Delivery date:01/10/2020  Delivering provider: Truett Mainland  Date of discharge: 01/13/2020  Admitting diagnosis: Status post cesarean section [Z98.891] Intrauterine pregnancy: [redacted]w[redacted]d    Secondary diagnosis:  Active Problems:   History of cesarean delivery   History of syphilis   Status post cesarean section  Additional problems: None    Discharge diagnosis: Term Pregnancy Delivered                                              Post partum procedures:None Augmentation: N/A Complications: None  Hospital course: Sceduled C/S   29y.o. yo [id number] 377w3das admitted to the hospital 01/10/2020 for scheduled cesarean section with the following indication:Elective Repeat.Delivery details are as follows:  Membrane Rupture Time/Date: 10:58 AM ,01/10/2020   Delivery Method:C-Section, Low Transverse  Details of operation can be found in separate operative note.  Patient had an uncomplicated postpartum course. She did stay until POD#3 after a fall on POD#2, likely vasovagal syncope. She is ambulating, tolerating a regular diet, passing flatus, and urinating well. Patient is discharged home in stable condition on  01/13/20        Newborn Data: Birth date:01/10/2020  Birth time:10:59 AM  Gender:Female  Living status:Living  Apgars:8 ,9  Weight:2935 g     Magnesium Sulfate received: No BMZ received: No Rhophylac:N/A MMR:N/A T-DaP:declined Flu: No Transfusion:No  Physical exam  Vitals:   01/12/20 1117 01/12/20 1500 01/12/20 2000 01/13/20 0510  BP: 118/81 118/72 126/73 120/69  Pulse: 68 (!) 59 (!) 57 63  Resp:  '16 18 18  ' Temp: 98.6 F (37 C) 98.4 F (36.9 C) 97.9 F (36.6 C) 98.6 F (37 C)  TempSrc: Oral Oral Oral Oral  SpO2:   99%    General: alert, cooperative and no distress Lochia: appropriate Uterine Fundus: firm Incision: dried blood  underneath honeycomb; will change prior to DC DVT Evaluation: No evidence of DVT seen on physical exam. Labs: Lab Results  Component Value Date   WBC 11.6 (H) 01/11/2020   HGB 9.6 (L) 01/11/2020   HCT 29.9 (L) 01/11/2020   MCV 92.0 01/11/2020   PLT 228 01/11/2020   CMP Latest Ref Rng & Units 08/25/2019  Glucose 70 - 99 mg/dL 86  BUN 6 - 20 mg/dL 7  Creatinine 0.44 - 1.00 mg/dL 0.52  Sodium 135 - 145 mmol/L 133(L)  Potassium 3.5 - 5.1 mmol/L 3.9  Chloride 98 - 111 mmol/L 105  CO2 22 - 32 mmol/L 21(L)  Calcium 8.9 - 10.3 mg/dL 8.5(L)  Total Protein 6.5 - 8.1 g/dL 6.7  Total Bilirubin 0.3 - 1.2 mg/dL 0.4  Alkaline Phos 38 - 126 U/L 51  AST 15 - 41 U/L 13(L)  ALT 0 - 44 U/L 10   Edinburgh Score: Edinburgh Postnatal Depression Scale Screening Tool 01/10/2020  I have been able to laugh and see the funny side of things. 0  I have looked forward with enjoyment to things. 0  I have blamed myself unnecessarily when things went wrong. 1  I have been anxious or worried for no good reason. 1  I have felt scared or panicky for no good reason. 1  Things have been getting on top of me. 1  I have  been so unhappy that I have had difficulty sleeping. 1  I have felt sad or miserable. 1  I have been so unhappy that I have been crying. 0  The thought of harming myself has occurred to me. 0  Edinburgh Postnatal Depression Scale Total 6     After visit meds:  Allergies as of 01/13/2020   No Known Allergies     Medication List    STOP taking these medications   diphenhydramine-acetaminophen 25-500 MG Tabs tablet Commonly known as: TYLENOL PM   liver oil-zinc oxide 40 % ointment Commonly known as: DESITIN   nicotine 14 mg/24hr patch Commonly known as: Nicoderm CQ   omeprazole 20 MG tablet Commonly known as: PRILOSEC OTC     TAKE these medications   ferrous sulfate 325 (65 FE) MG tablet Take 1 tablet (325 mg total) by mouth daily with breakfast.   ibuprofen 800 MG tablet Commonly  known as: ADVIL Take 1 tablet (800 mg total) by mouth every 6 (six) hours.   oxyCODONE 5 MG immediate release tablet Commonly known as: Roxicodone Take 1 tablet (5 mg total) by mouth every 4 (four) hours as needed for moderate pain.   polyethylene glycol 17 g packet Commonly known as: MIRALAX / GLYCOLAX Take 17 g by mouth daily.   senna-docusate 8.6-50 MG tablet Commonly known as: Senokot-S Take 2 tablets by mouth daily.        Discharge home in stable condition Infant Feeding: Bottle and Breast Infant Disposition:home with mother Discharge instruction: per After Visit Summary and Postpartum booklet. Activity: Advance as tolerated. Pelvic rest for 6 weeks.  Diet: routine diet Future Appointments:No future appointments. Follow up Visit:   Please schedule this patient for a In person postpartum visit in 4 weeks with the following provider: Any provider. Additional Postpartum F/U:Incision check 1 week  Low risk pregnancy complicated by: syphilis treatment Delivery mode:  C-Section, Low Transverse  Anticipated Birth Control:  Condoms   01/13/2020 Chauncey Mann, MD

## 2020-01-13 NOTE — Lactation Note (Signed)
This note was copied from a baby's chart. Lactation Consultation Note  Patient Name: Christina Woodard Date: 01/13/2020 Reason for consult: Follow-up assessment   Baby 73 hours old.  Mother's breasts are full and she states her nipples are tender.  No cracks or abrasions. Mother concerned about breasts.  Explained that her milk is coming in and she has plenty of breastmilk.  Suggest she wear shells with ebm when she puts on her bra later.  Infant's stools are yellow.  She recently pumped 20 ml with her hand pump.  She states she only gave formula once last night. Encouraged her to breastfeed first, then pump for 5 min after for comfort and apply ice packs top and bottom for 10 min. Suggest waiting on pacifier use and feed on demand 8-12 times per day. Recommend massaging breasts during pumping to empty.   Observed feeding to empty breast and compress breast to latch. Frequent swallows observed.  Discussed milk storage. FOB gave pumped 20 ml of breastmilk with slow flow nipple after breastfeeding so mother could eat breakfast.       Maternal Data Has patient been taught Hand Expression?: Yes  Feeding Feeding Type: Breast Fed  LATCH Score Latch: Repeated attempts needed to sustain latch, nipple held in mouth throughout feeding, stimulation needed to elicit sucking reflex.  Audible Swallowing: Spontaneous and intermittent  Type of Nipple: Everted at rest and after stimulation  Comfort (Breast/Nipple): Filling, red/small blisters or bruises, mild/mod discomfort  Hold (Positioning): No assistance needed to correctly position infant at breast.  LATCH Score: 8  Interventions Interventions: Breast feeding basics reviewed;Assisted with latch;Hand express;Shells;Hand pump  Lactation Tools Discussed/Used Breast pump type: Manual   Consult Status Consult Status: Complete Date: 01/13/20    Dahlia Byes [facility] 01/13/2020, 8:47 AM

## 2020-01-15 ENCOUNTER — Telehealth: Payer: Self-pay

## 2020-01-15 ENCOUNTER — Encounter (HOSPITAL_COMMUNITY): Payer: Self-pay | Admitting: Obstetrics and Gynecology

## 2020-01-15 ENCOUNTER — Inpatient Hospital Stay (HOSPITAL_COMMUNITY)
Admission: AD | Admit: 2020-01-15 | Discharge: 2020-01-15 | Disposition: A | Discharge status: Home / Self Care | Payer: Medicaid Other | Attending: Obstetrics and Gynecology | Admitting: Obstetrics and Gynecology

## 2020-01-15 ENCOUNTER — Other Ambulatory Visit: Payer: Self-pay

## 2020-01-15 DIAGNOSIS — F17210 Nicotine dependence, cigarettes, uncomplicated: Secondary | ICD-10-CM | POA: Insufficient documentation

## 2020-01-15 DIAGNOSIS — Z79899 Other long term (current) drug therapy: Secondary | ICD-10-CM | POA: Insufficient documentation

## 2020-01-15 DIAGNOSIS — O900 Disruption of cesarean delivery wound: Secondary | ICD-10-CM | POA: Diagnosis not present

## 2020-01-15 DIAGNOSIS — Z791 Long term (current) use of non-steroidal anti-inflammatories (NSAID): Secondary | ICD-10-CM | POA: Diagnosis not present

## 2020-01-15 DIAGNOSIS — O99335 Smoking (tobacco) complicating the puerperium: Secondary | ICD-10-CM | POA: Insufficient documentation

## 2020-01-15 MED ORDER — IBUPROFEN 800 MG PO TABS
800.0000 mg | ORAL_TABLET | Freq: Once | ORAL | Status: AC
  Administered 2020-01-15: 800 mg via ORAL
  Filled 2020-01-15: qty 1

## 2020-01-15 NOTE — Discharge Instructions (Signed)
Postpartum Care After Cesarean Delivery °This sheet gives you information about how to care for yourself from the time you deliver your baby to up to 6-12 weeks after delivery (postpartum period). Your health care provider may also give you more specific instructions. If you have problems or questions, contact your health care provider. °Follow these instructions at home: °Medicines °· Take over-the-counter and prescription medicines only as told by your health care provider. °· If you were prescribed an antibiotic medicine, take it as told by your health care provider. Do not stop taking the antibiotic even if you start to feel better. °· Ask your health care provider if the medicine prescribed to you: °? Requires you to avoid driving or using heavy machinery. °? Can cause constipation. You may need to take actions to prevent or treat constipation, such as: °§ Drink enough fluid to keep your urine pale yellow. °§ Take over-the-counter or prescription medicines. °§ Eat foods that are high in fiber, such as beans, whole grains, and fresh fruits and vegetables. °§ Limit foods that are high in fat and processed sugars, such as fried or sweet foods. °Activity °· Gradually return to your normal activities as told by your health care provider. °· Avoid activities that take a lot of effort and energy (are strenuous) until approved by your health care provider. Walking at a slow to moderate pace is usually safe. Ask your health care provider what activities are safe for you. °? Do not lift anything that is heavier than your baby or 10 lb (4.5 kg) as told by your health care provider. °? Do not vacuum, climb stairs, or drive a car for as long as told by your health care provider. °· If possible, have someone help you at home until you are able to do your usual activities yourself. °· Rest as much as possible. Try to rest or take naps while your baby is sleeping. °Vaginal bleeding °· It is normal to have vaginal bleeding  (lochia) after delivery. Wear a sanitary pad to absorb vaginal bleeding and discharge. °? During the first week after delivery, the amount and appearance of lochia is often similar to a menstrual period. °? Over the next few weeks, it will gradually decrease to a dry, yellow-brown discharge. °? For most women, lochia stops completely by 4-6 weeks after delivery. Vaginal bleeding can vary from woman to woman. °· Change your sanitary pads frequently. Watch for any changes in your flow, such as: °? A sudden increase in volume. °? A change in color. °? Large blood clots. °· If you pass a blood clot, save it and call your health care provider to discuss. Do not flush blood clots down the toilet before you get instructions from your health care provider. °· Do not use tampons or douches until your health care provider says this is safe. °· If you are not breastfeeding, your period should return 6-8 weeks after delivery. If you are breastfeeding, your period may return anytime between 8 weeks after delivery and the time that you stop breastfeeding. °Perineal care ° °· If your C-section (Cesarean section) was unplanned, and you were allowed to labor and push before delivery, you may have pain, swelling, and discomfort of the tissue between your vaginal opening and your anus (perineum). You may also have an incision in the tissue (episiotomy) or the tissue may have torn during delivery. Follow these instructions as told by your health care provider: °? Keep your perineum clean and dry as told by   your health care provider. Use medicated pads and pain-relieving sprays and creams as directed. °? If you have an episiotomy or vaginal tear, check the area every day for signs of infection. Check for: °§ Redness, swelling, or pain. °§ Fluid or blood. °§ Warmth. °§ Pus or a bad smell. °? You may be given a squirt bottle to use instead of wiping to clean the perineum area after you go to the bathroom. As you start healing, you may use  the squirt bottle before wiping yourself. Make sure to wipe gently. °? To relieve pain caused by an episiotomy, vaginal tear, or hemorrhoids, try taking a warm sitz bath 2-3 times a day. A sitz bath is a warm water bath that is taken while you are sitting down. The water should only come up to your hips and should cover your buttocks. °Breast care °· Within the first few days after delivery, your breasts may feel heavy, full, and uncomfortable (breast engorgement). You may also have milk leaking from your breasts. Your health care provider can suggest ways to help relieve breast discomfort. Breast engorgement should go away within a few days. °· If you are breastfeeding: °? Wear a bra that supports your breasts and fits you well. °? Keep your nipples clean and dry. Apply creams and ointments as told by your health care provider. °? You may need to use breast pads to absorb milk leakage. °? You may have uterine contractions every time you breastfeed for several weeks after delivery. Uterine contractions help your uterus return to its normal size. °? If you have any problems with breastfeeding, work with your health care provider or a lactation consultant. °· If you are not breastfeeding: °? Avoid touching your breasts as this can make your breasts produce more milk. °? Wear a well-fitting bra and use cold packs to help with swelling. °? Do not squeeze out (express) milk. This causes you to make more milk. °Intimacy and sexuality °· Ask your health care provider when you can engage in sexual activity. This may depend on your: °? Risk of infection. °? Healing rate. °? Comfort and desire to engage in sexual activity. °· You are able to get pregnant after delivery, even if you have not had your period. If desired, talk with your health care provider about methods of family planning or birth control (contraception). °Lifestyle °· Do not use any products that contain nicotine or tobacco, such as cigarettes, e-cigarettes,  and chewing tobacco. If you need help quitting, ask your health care provider. °· Do not drink alcohol, especially if you are breastfeeding. °Eating and drinking ° °· Drink enough fluid to keep your urine pale yellow. °· Eat high-fiber foods every day. These may help prevent or relieve constipation. High-fiber foods include: °? Whole grain cereals and breads. °? Brown rice. °? Beans. °? Fresh fruits and vegetables. °· Take your prenatal vitamins until your postpartum checkup or until your health care provider tells you it is okay to stop. °General instructions °· Keep all follow-up visits for you and your baby as told by your health care provider. Most women visit their health care provider for a postpartum checkup within the first 3-6 weeks after delivery. °Contact a health care provider if you: °· Feel unable to cope with the changes that a new baby brings to your life, and these feelings do not go away. °· Feel unusually sad or worried. °· Have breasts that are painful, hard, or turn red. °· Have a fever. °·   Have trouble holding urine or keeping urine from leaking.  Have little or no interest in activities you used to enjoy.  Have not breastfed at all and you have not had a menstrual period for 12 weeks after delivery.  Have stopped breastfeeding and you have not had a menstrual period for 12 weeks after you stopped breastfeeding.  Have questions about caring for yourself or your baby.  Pass a blood clot from your vagina. Get help right away if you:  Have chest pain.  Have difficulty breathing.  Have sudden, severe leg pain.  Have severe pain or cramping in your abdomen.  Bleed from your vagina so much that you fill more than one sanitary pad in one hour. Bleeding should not be heavier than your heaviest period.  Develop a severe headache.  Faint.  Have blurred vision or spots in your vision.  Have a bad-smelling vaginal discharge.  Have thoughts about hurting yourself or your  baby. If you ever feel like you may hurt yourself or others, or have thoughts about taking your own life, get help right away. You can go to your nearest emergency department or call:  Your local emergency services (911 in the [place]  A suicide crisis helpline, such as the National Suicide Prevention Lifeline at 934-537-2809. This is open 24 hours a day. Summary  The period of time from when you deliver your baby to up to 6-12 weeks after delivery is called the postpartum period.  Gradually return to your normal activities as told by your health care provider.  Keep all follow-up visits for you and your baby as told by your health care provider. This information is not intended to replace advice given to you by your health care provider. Make sure you discuss any questions you have with your health care provider. Document Revised: 02/12/2018 Document Reviewed: 02/12/2018 Elsevier Patient Education  2020 Elsevier Inc.   Cuidados en el postparto luego de un parto por cesrea Postpartum Care After Cesarean Delivery Lea esta informacin sobre cmo cuidarse desde el momento en que nazca su beb y [place] 6 a 12 semanas despus del parto (perodo del posparto). El mdico tambin podr darle indicaciones ms especficas. Comunquese con el mdico si tiene problemas o preguntas. Siga estas indicaciones en su casa: Medicamentos  [employer] de venta libre y los recetados solamente como se lo haya indicado el mdico.  Si le recetaron un antibitico, tmelo como se lo haya indicado el mdico. No deje de tomar el antibitico aunque comience a sentirse mejor.  Pregntele al mdico si el medicamento recetado: ? Hace que sea necesario que evite conducir o usar maquinaria pesada. ? Puede causarle estreimiento. Es posible que deba tomar medidas para prevenir o tratar el estreimiento, por ejemplo:  Beber suficiente lquido como para Pharmacologist la orina de color amarillo plido.  Tomar  medicamentos recetados o de [employer].  Consumir alimentos ricos en fibra, como frijoles, cereales integrales, y frutas y verduras frescas.  Limitar su consumo de alimentos ricos en grasa y azcares procesados, como los alimentos fritos o dulces. Actividad  Retome sus actividades normales de a poco segn lo indicado por el mdico.  Evite las actividades que demandan mucho esfuerzo y Engineer, drilling (que son extenuantes) hasta que el mdico se lo autorice. Siempre es ms seguro caminar a un ritmo tranquilo a moderado. Pregntele al mdico qu actividades son seguras para usted. ? No levante objetos que pesen ms que su beb o ms de 10 libras (4,5 kg),  como se lo haya indicado el mdico. ? No pase la aspiradora, suba escaleras ni conduzca un vehculo durante el tiempo que le indique el mdico.  De ser posible, pdale a alguien que le brinde ayuda con las tareas domsticas hasta que pueda realizar sus actividades habituales por su cuenta.  Descanse todo lo que pueda. Trate de descansar o tomar una siesta mientras el beb duerme. Hemorragia vaginal  Es normal tener un poco de hemorragia vaginal (loquios) despus del parto. Use un apsito sanitario para absorber el sangrado vaginal y la secrecin. ? Durante la primera semana despus del parto, la cantidad y el aspecto de los loquios a menudo es similar a las del perodo menstrual. ? Durante las siguientes semanas disminuir gradualmente hasta convertirse en una secrecin seca amarronada o [place]. ? En la [employer], los loquios se detienen [employer] 4 a 6semanas despus del [place]. Los sangrados vaginales pueden variar de mujer a Nurse, learning disability.  Cambie los apsitos sanitarios con frecuencia. Observe si hay cambios en el flujo, como: ? Aumento repentino en el volumen. ? Cambio en el color. ? Cogulos sanguneos grandes.  Si expulsa un cogulo de sangre, gurdelo y llame al mdico para informrselo. No deseche los cogulos de  sangre por el inodoro antes de recibir indicaciones del mdico.  No use tampones ni se haga duchas vaginales hasta que el mdico la autorice.  Si no est amamantando, volver a tener su perodo entre 6 y 8 semanas despus del parto. Si est amamantando, puede volver a tener su perodo [place] 8 semanas despus del parto y el momento en que deje de Museum/gallery exhibitions officer. Cuidados perineales   Si su cesrea no fue planeada, y pas por el proceso de [place] de parto y puj antes del nacimiento, podra Surveyor, mining, hinchazn y Associate Professor del tejido que se encuentra entre la abertura de la vagina y el ano (perineo). Tambin pueden haberle hecho una incisin en el tejido (episiotoma) o el tejido puede haberse desgarrado durante el [place]. Siga las siguientes indicaciones como se lo haya indicado su mdico: ? Mantenga el perineo limpio y Personnel officer se lo haya indicado el mdico. Utilice apsitos o aerosoles analgsicos y Control and instrumentation engineer, como se lo hayan indicado. ? Si le realizaron una episiotoma o un desgarro vaginal, controle la zona [employer] para detectar signos de infeccin. Est atento a los siguientes signos:  Enrojecimiento, Optician, dispensing.  Lquido o sangre.  Calor.  Pus o mal olor. ? Es posible que le den una botella rociadora para que use en lugar de limpiarse el rea con papel higinico despus de usar el bao. Cuando comience a Barrister's clerk, podr usar la botella rociadora antes de secarse. Asegrese de secarse suavemente. ? Para Engineer, materials causado por una episiotoma, un desgarro vaginal o hemorroides, trate de tomar un bao de asiento tibio 2 o 3 veces por da. Un bao de asiento es un bao de agua tibia que se toma mientras se est sentado. El agua solo debe Adult nurse las caderas y cubrir las nalgas. Cuidado de las [address]  En los [address] despus del parto, las mamas pueden sentirse pesadas, llenas e incmodas (congestin [place]). Tambin puede tener Mirant se escapa de sus senos. El  mdico puede sugerirle mtodos para Systems analyst. La congestin mamaria debera desaparecer al cabo de [employer].  Si est amamantando: ? Use un sostn que sujete y ajuste bien sus pechos. ? Mantenga los pezones secos y limpios. Aplquese  cremas y Energy Transfer Partnersungentos como se lo haya indicado el mdico. ? Es posible que deba usar discos de algodn en el sostn para Insurance account managerabsorber la Mirantleche que se filtre. ? Puede tener contracciones uterinas cada vez que amamante durante varias semanas despus del [place]. Las contracciones uterinas ayudan al tero a Hotel managerregresar a su tamao habitual. ? Si tiene algn problema con la lactancia materna, consulte con su mdico o con un Holiday representativeasesor en lactancia.  Si no est amamantando: ? Evite tocarse las [address], ya que esto puede hacer que produzcan ms [place]. ? Use un sostn que le proporcione el ajuste correcto y compresas fras para reducir la hinchazn. ? No extraiga (saque) Colgate Palmoliveleche materna. Esto har que produzca ms WPS Resourcesleche. Intimidad y sexualidad  Pregntele al mdico cundo puede retomar la actividad sexual. Esto puede depender de lo siguiente: ? Riesgo de sufrir una infeccin. ? Velocidad de cicatrizacin. ? Comodidad y deseo de Wachovia Corporationretomar la actividad sexual.  Despus del parto, puede quedar embarazada incluso si no ha tenido todava su perodo. Si lo desea, hable con el mdico acerca de los mtodos de planificacin familiar o control de la natalidad (mtodos anticonceptivos). Estilo de vida  No consuma ningn producto que contenga nicotina o tabaco, como cigarrillos, cigarrillos electrnicos y tabaco de Theatre managermascar. Si necesita ayuda para dejar de consumir, consulte al mdico.  No beba alcohol, especialmente si est amamantando. Comida y bebida   Beba suficiente lquido como para Pharmacologistmantener la orina de color amarillo plido.  Coma alimentos ricos en Enbridge Energyfibras todos los das. Estos pueden ayudarla a prevenir o Educational psychologistaliviar el estreimiento. Los alimentos ricos en fibra incluyen  los siguientes: ? Panes y cereales integrales. ? Arroz integral. ? Armed forces operational officerrijoles. ? Nils PyleFrutas y verduras frescas.  Tome sus vitaminas prenatales hasta la visita de control de posparto o hasta que su mdico le indique que puede dejar de tomarlas. Indicaciones generales  Concurra a todas las visitas de control para usted y el beb como se lo haya indicado el mdico. La mayora de las mujeres visita al mdico para un control de posparto dentro de las primeras 3 a 6 semanas despus del parto. Comunquese con un mdico si:  Se siente incapaz de controlar los cambios que implica tener un beb recin nacido y esos sentimientos no desaparecen.  Siente tristeza o preocupacin de forma inusual.  Siente dolor en las mamas, o estn duras o enrojecidas.  Tiene fiebre.  Tiene dificultad para retener la [place] o para impedir que la orina se escape.  Tiene poco inters o falta de inters en actividades que solan gustarle.  No ha amamantado para nada y no ha tenido un perodo menstrual durante 12 semanas despus del [place]o.  Dej de amamantar al beb y no ha tenido su perodo menstrual durante 12 semanas despus de dejar de Museum/gallery exhibitions officeramamantar.  Tiene preguntas sobre su cuidado o el del beb.  Elimina un cogulo de sangre por la vagina. Solicite ayuda inmediatamente si:  Midwifeiente dolor en el pecho.  Presenta dificultad para respirar.  Tiene un dolor repentino e intenso en la pierna.  Tiene dolor intenso o clicos en el abdomen.  Tiene un sangrado tan intenso en la vagina que empapa ms de un apsito sanitario en Marshall & Ilsleyuna hora. El sangrado no debe ser ms abundante que el perodo ms intenso que haya tenido.  Presenta dolor de cabeza intenso.  Se desmaya.  Tiene visin borrosa o Nurse, adultmanchas en la vista.  Tiene secrecin vaginal con mal olor.  Tiene pensamientos de autolesionarse o de Engineer, drillinglesionar  al beb. Si alguna vez siente que puede lastimarse o Physicist, medical a Economist, o tiene pensamientos de poner fin a su  vida, busque ayuda de inmediato. Puede dirigirse al servicio de emergencias ms cercano o comunicarse con:  El servicio de emergencias de su localidad (911 en [place]  Una lnea de asistencia al suicida y Visual merchandiser en crisis, como National Suicide Prevention Lifeline (Lnea Nacional de Prevencin del Suicidio), al 708-669-1953. Est disponible las 24 horas del da. Resumen  El perodo de tiempo desde el parto y [place] 6 a 12 semanas despus del parto se denomina perodo posparto.  Retome sus actividades normales de a poco segn lo indicado por el mdico.  Concurra a todas las visitas de control para usted y Research scientist (physical sciences) se lo haya indicado el mdico. Esta informacin no tiene Theme park manager el consejo del mdico. Asegrese de hacerle al mdico cualquier pregunta que tenga. Document Revised: 03/26/2018 Document Reviewed: 03/26/2018 Elsevier Patient Education  2020 [employer].

## 2020-01-15 NOTE — MAU Provider Note (Signed)
History    CSN: [id number]  Arrival date and time: 01/15/20 1407   First Provider Initiated Contact with Patient 01/15/20 1446      Chief Complaint  Patient presents with  . Post-op Problem   Christina Woodard is a 29 y.o. G3P2012 POD #5 after rCS c/o incisional bleeding, bruising and pain. She has not taken anything today for the pain, admits to rarely resting and has been very active since her d/c. She also presents with engorgement r/t her belief that nursingpumping was causing her incisional bleeding, so she has not nursed or pumped since last night.   Past Medical History:  Diagnosis Date  . Medical history non-contributory   . Syphilis     Past Surgical History:  Procedure Laterality Date  . CESAREAN SECTION    . CESAREAN SECTION    . CESAREAN SECTION N/A 01/10/2020   Procedure: CESAREAN SECTION;  Surgeon: Levie Heritage, DO;  Location: [facility];  Service: Obstetrics;  Laterality: N/A;    Family History  Problem Relation Age of Onset  . Healthy Mother   . Healthy Father     Social History   Tobacco Use  . Smoking status: Current Every Day Smoker    Packs/day: 1.00    Types: Cigarettes  . Smokeless tobacco: Never Used  Vaping Use  . Vaping Use: Never assessed  Substance Use Topics  . Alcohol use: Yes    Comment: occ  . Drug use: Yes    Types: Marijuana    Comment: last smoked January 2021   Allergies: No Known Allergies  Medications Prior to Admission  Medication Sig Dispense Refill Last Dose  . ferrous sulfate 325 (65 FE) MG tablet Take 1 tablet (325 mg total) by mouth daily with breakfast. 60 tablet 0 Past Week at Unknown time  . ibuprofen (ADVIL) 800 MG tablet Take 1 tablet (800 mg total) by mouth every 6 (six) hours. 30 tablet 0 01/14/2020 at Unknown time  . oxyCODONE (ROXICODONE) 5 MG immediate release tablet Take 1 tablet (5 mg total) by mouth every 4 (four) hours as needed for moderate pain. 24 tablet 0 01/14/2020 at Unknown time  . polyethylene  glycol (MIRALAX / GLYCOLAX) 17 g packet Take 17 g by mouth daily. 14 each 0 Unknown at Unknown time  . senna-docusate (SENOKOT-S) 8.6-50 MG tablet Take 2 tablets by mouth daily. 30 tablet 0 Unknown at Unknown time    Review of Systems  Constitutional: Positive for fatigue. Negative for diaphoresis and fever.  HENT: Negative.   Eyes: Negative for photophobia and visual disturbance.  Respiratory: Negative for cough, chest tightness and shortness of breath.   Cardiovascular: Negative for chest pain, palpitations and leg swelling.  Gastrointestinal: Negative for abdominal distention, abdominal pain, constipation, diarrhea, nausea and vomiting.  Endocrine: Negative.   Genitourinary: Negative for pelvic pain, vaginal bleeding, vaginal discharge and vaginal pain.  Musculoskeletal: Negative for back pain, myalgias and neck pain.  Skin: Positive for wound (low transverse CS incision).  Allergic/Immunologic: Negative.   Neurological: Negative for dizziness, syncope, light-headedness and headaches.  Hematological: Negative.   Psychiatric/Behavioral: Negative.    Physical Exam   currently breastfeeding.  Physical Exam Vitals and nursing note reviewed.  Constitutional:      Appearance: Normal appearance.  HENT:     Head: Normocephalic.     Mouth/Throat:     Mouth: Mucous membranes are moist.  Eyes:     Pupils: Pupils are equal, round, and reactive to light.  Cardiovascular:     Rate and Rhythm: Normal rate and regular rhythm.     Pulses: Normal pulses.     Heart sounds: Normal heart sounds.  Pulmonary:     Effort: Pulmonary effort is normal.     Breath sounds: Normal breath sounds.  Abdominal:     General: Abdomen is flat. Bowel sounds are normal.     Palpations: Abdomen is soft.  Genitourinary:    Comments: Bruising present on the mons pubis and to the right of incision. Incision well approximated, oozes blood with even mild palpation around the site. Steri strips saturated. No  additional redness, warmth, tunneling or tenderness around the site noted.  Musculoskeletal:        General: Normal range of motion.     Cervical back: Normal range of motion.  Skin:    Capillary Refill: Capillary refill takes less than 2 seconds.     Comments: Breasts are very full (begin to leak with light palpation), edematous, but without redness or excessive warmth.   Neurological:     Mental Status: She is alert and oriented to person, place, and time.  Psychiatric:        Mood and Affect: Mood normal.        Behavior: Behavior normal.        Thought Content: Thought content normal.        Judgment: Judgment normal.     MAU Course   MDM 800mg  ibuprofen - breast pain relieved, pain with moving. Tenderness remained at incision site.  Consulted Dr. regarding wound healing - agreed to have steri strips and honeycomb dressing reapplied. When steri strips were taken off, the wound began to bleed more profusely. Steri strips and honeycomb applied, Dr. Donavan Foil consulted and applied a pressure dressing with instructions to remove just the pressure dressing after 48hrs.  Ice applied to breasts with pt in supine position for [name], then massage, and pt began pumping with DEBP. Consult to lactation for information on pump rentals as pt only has a hand pump and is giving both human milk and formula.  Assessment and Plan   1. Postpartum cesarean wound disruption    Reinforced instructions to rest at home and not lift anything heavier than the baby. FOB at bedside verbalized that he will help her rest. Discharge home in stable condition Post-op bleeding precautions given Follow up 7/15 for incision check   8/15, CNM, IBCLC 01/15/2020, 3:35 PM

## 2020-01-15 NOTE — MAU Note (Signed)
C/s 7/4. Incision started leaking last night, small amt, pt reinforced dressing, leaking through that also (red blood). Had removed honeycomb as instructed, took a shower later that day.  Few hours after that was when leaking started.  Had also been more active. Area around incision is red and bruised.

## 2020-01-15 NOTE — Lactation Note (Signed)
Lactation Consultation Note  Patient Name: Christina Woodard [id number] Date: 01/15/2020  Mom came back to MAU for some complications. Mom has not breastfed since this am.  Mom reports that she thought breastfeeding could be related to her bleeding. Mom has been using a manual breast pump at home. Mom interested in an electric pump.  Mom not on [facility] and has Medicaid.  Discussed rental options at gift store.  Discussed estimated costs for good single user pump at retail store.  Asked mom if she would like me to try and help her find a single user pump because she does not want to do the rental, reprots she could buy a pump pretty much for that.  Agreed with mom. Mom reports that they are fine.  She will look when they get home. Infant sucking on pacifier.  Showing hunger cues.  Mom reports doctor told her not to feed infant but every 21/2 to 3 hours.   Moms breast are now softened from DEBP.  Mom reports she pumped 5 bottles before they softened. Asked if we could put infant on breasts since cuing.  Mom agreed.  Assisted in latching infant to left breast.  Infant latched and started sucking and mom reports pain.   Mom unlatched her without breaking suction.  Asked if we could try again, mom agreed/ same thing again.   Urged her to offer the breast 8-12 or more times day based on hunger cues.  And d/c pacifier unless advised to givce until bf going well.  Urged mom to call lactation as needed.      aternal Data    Feeding    LATCH Score                   Interventions    Lactation Tools Discussed/Used     Consult Status      [name] 01/15/2020, 4:39 PM

## 2020-01-15 NOTE — Telephone Encounter (Signed)
Pt called stating she removed her honeycomb dressing yesterday and noticed that her incision was bleeding. Pt states that the blood soaked through her shirt. Because we close at 12 today, I  advised pt to go to [facility] to have a physician look at her incision. Understanding was voiced.  Shakirah Kirkey l Tadarius Maland, CMA

## 2020-01-15 NOTE — MAU Note (Signed)
Lactation consultant contacted and coming to speak with patient about renting pump.

## 2020-01-20 ENCOUNTER — Other Ambulatory Visit: Payer: Self-pay

## 2020-01-20 ENCOUNTER — Ambulatory Visit (HOSPITAL_BASED_OUTPATIENT_CLINIC_OR_DEPARTMENT_OTHER): Payer: Medicaid Other | Admitting: Obstetrics & Gynecology

## 2020-01-20 ENCOUNTER — Encounter: Payer: Self-pay | Admitting: Obstetrics & Gynecology

## 2020-01-20 VITALS — BP 144/80 | HR 65 | Ht 65.0 in | Wt 154.0 lb

## 2020-01-20 DIAGNOSIS — Z98891 History of uterine scar from previous surgery: Secondary | ICD-10-CM

## 2020-01-20 DIAGNOSIS — R030 Elevated blood-pressure reading, without diagnosis of hypertension: Secondary | ICD-10-CM

## 2020-01-20 DIAGNOSIS — Z9889 Other specified postprocedural states: Secondary | ICD-10-CM

## 2020-01-20 DIAGNOSIS — O9089 Other complications of the puerperium, not elsewhere classified: Secondary | ICD-10-CM

## 2020-01-20 NOTE — Progress Notes (Signed)
History:  29 y.o.LMP here today for 2 week post op check.Pt is s/p c/s on 01/10/2020.  Pt reports that she is doing well. She is eating and passing stols without difficulty.   She was seen in the MAU after her Honeycombed dressing fell off she was scared to remove it on her own. She denies pain. She has noted bruising beside the incision.    The following portions of the patient's history were reviewed and updated as appropriate: allergies, current medications, past family history, past medical history, past social history, past surgical history and problem list.  Review of Systems:  Pertinent items are noted in HPI.    Objective:  Physical Exam BP (!) 144/80   Pulse 65   Ht 5\' 5"  (1.651 m)   Wt 154 lb (69.9 kg)   Breastfeeding Yes   BMI 25.63 kg/m   CONSTITUTIONAL: Well-developed, well-nourished female in no acute distress.  HENT:  Normocephalic, atraumatic EYES: Conjunctivae and EOM are normal. No scleral icterus.  NECK: Normal range of motion SKIN: Skin is warm and dry. No rash noted. Not diaphoretic.No pallor. NEUROLGIC: Alert and oriented to person, place, and time. Normal coordination.  Abd: Soft, nontender and nondistended; her dressing has a large amount of blood. The incision is intact.  There is blood under the incision. No induration or fluctuance.    Assessment & Plan:  2 week post op check following cesarean section. Pt is doing well but, incision has what appear to be a clot underneath but, it is intact.   Reviewed post op instructions and activities  F/u in 2 days or sooner prn  All questions answered.   Gwenetta Devos L. Harraway-Smith, M.D., 

## 2020-01-20 NOTE — Progress Notes (Signed)
Patient is breastfeeding. Patient is 10 days post c-section. Armandina Stammer RN

## 2020-01-21 ENCOUNTER — Ambulatory Visit: Payer: Medicaid Other | Admitting: Family Medicine

## 2020-01-22 ENCOUNTER — Ambulatory Visit (HOSPITAL_BASED_OUTPATIENT_CLINIC_OR_DEPARTMENT_OTHER): Payer: Medicaid Other | Admitting: Obstetrics & Gynecology

## 2020-01-22 ENCOUNTER — Encounter: Payer: Self-pay | Admitting: Obstetrics & Gynecology

## 2020-01-22 ENCOUNTER — Other Ambulatory Visit: Payer: Self-pay

## 2020-01-22 VITALS — BP 145/92 | HR 57 | Ht 65.0 in | Wt 152.0 lb

## 2020-01-22 DIAGNOSIS — O135 Gestational [pregnancy-induced] hypertension without significant proteinuria, complicating the puerperium: Secondary | ICD-10-CM

## 2020-01-22 DIAGNOSIS — O169 Unspecified maternal hypertension, unspecified trimester: Secondary | ICD-10-CM

## 2020-01-22 DIAGNOSIS — Z98891 History of uterine scar from previous surgery: Secondary | ICD-10-CM

## 2020-01-22 DIAGNOSIS — Z4889 Encounter for other specified surgical aftercare: Secondary | ICD-10-CM

## 2020-01-22 NOTE — Progress Notes (Signed)
History:  29 y.o. H6P5916 here today for incision check. Pt is POD # 12 from a c/s.  The incision is still draining some. There is no purulent drainage. No increased pain. She denies fever or chills.     The following portions of the patient's history were reviewed and updated as appropriate: allergies, current medications, past family history, past medical history, past social history, past surgical history and problem list.  Review of Systems:  Pertinent items are noted in HPI.    Objective:  Physical Exam Blood pressure (!) 145/92, pulse (!) 57, height 5\' 5"  (1.651 m), weight 152 lb (68.9 kg), currently breastfeeding.  CONSTITUTIONAL: Well-developed, well-nourished female in no acute distress.  HENT:  Normocephalic, atraumatic EYES: Conjunctivae and EOM are normal. No scleral icterus.  NECK: Normal range of motion SKIN: Skin is warm and dry. No rash noted. Not diaphoretic.No pallor. NEUROLGIC: Alert and oriented to person, place, and time. Normal coordination.  Abd: Soft, nontender and nondistended. Incision still with serosanguinous drainage with expression. Nontender. NOT fluctuant. Still firm around the incision.     Assessment & Plan:  Incision check- unchanged from 2 days prev  BP sl elevated. Pt is very active right now. I have asked her to rest for the next few days. She will f/u in 3 days for a repeat BP check.   I have reviewed that she needs to be seen if she develops fever, chills, increased incisional pain, purulent drainage, HA or visual changes or just doesn't feel well.   Johngabriel Verde L. Harraway-Smith, M.D., 

## 2020-01-25 ENCOUNTER — Ambulatory Visit (HOSPITAL_BASED_OUTPATIENT_CLINIC_OR_DEPARTMENT_OTHER): Payer: Medicaid Other | Admitting: Obstetrics & Gynecology

## 2020-01-25 ENCOUNTER — Other Ambulatory Visit: Payer: Self-pay

## 2020-01-25 VITALS — BP 142/99 | HR 71 | Wt 152.0 lb

## 2020-01-25 DIAGNOSIS — Z720 Tobacco use: Secondary | ICD-10-CM

## 2020-01-25 DIAGNOSIS — O99335 Smoking (tobacco) complicating the puerperium: Secondary | ICD-10-CM

## 2020-01-25 DIAGNOSIS — O165 Unspecified maternal hypertension, complicating the puerperium: Secondary | ICD-10-CM

## 2020-01-25 DIAGNOSIS — F17200 Nicotine dependence, unspecified, uncomplicated: Secondary | ICD-10-CM

## 2020-01-25 MED ORDER — AMLODIPINE BESYLATE 5 MG PO TABS: 5.0000 mg | ORAL_TABLET | Freq: Every day | ORAL | 2 refills | Status: DC

## 2020-01-25 NOTE — Progress Notes (Signed)
History:  29 y.o. [id number] here today for incision and BP check. Pt reports that the leakage is less and the incision feels better. She denies HA or visual changes. She reports that she smoked a fair amount. She did not stop smoking during pregnancy. She reports that multiple family members smoke. She denies visual changes or HA.    The following portions of the patient's history were reviewed and updated as appropriate: allergies, current medications, past family history, past medical history, past social history, past surgical history and problem list.  Review of Systems:  Pertinent items are noted in HPI.    Objective:  Physical Exam Blood pressure (!) 142/99, pulse 71, weight 152 lb (68.9 kg), not currently breastfeeding.  CONSTITUTIONAL: Well-developed, well-nourished female in no acute distress.  HENT:  Normocephalic, atraumatic EYES: Conjunctivae and EOM are normal. No scleral icterus.  NECK: Normal range of motion SKIN: Skin is warm and dry. No rash noted. Not diaphoretic.No pallor. NEUROLGIC: Alert and oriented to person, place, and time. Normal coordination.  Abd: Soft, nontender and nondistended. The area around the incision is much softer. No drainage.  Pelvic: not done     Assessment & Plan:  Diagnoses and all orders for this visit:  Postpartum hypertension -     amLODipine (NORVASC) 5 MG tablet; Take 1 tablet (5 mg total) by mouth daily.  Tobacco abuse -     amLODipine (NORVASC) 5 MG tablet; Take 1 tablet (5 mg total) by mouth daily.  referred to primary care for tobacco session management.  F/u in 2-4 weeks for PP exam   Pt instructed to take BP at home weekly with BP cuff given during pregnancy.   Elgene Coral L. Harraway-Smith, M.D., [place]n [place]

## 2020-01-25 NOTE — Progress Notes (Signed)
Incision check. Nirvana Blanchett RN 

## 2020-01-26 ENCOUNTER — Encounter: Payer: Self-pay | Admitting: Obstetrics & Gynecology

## 2020-01-27 ENCOUNTER — Telehealth: Payer: Self-pay

## 2020-01-27 NOTE — Telephone Encounter (Signed)
Attempted to reach patient at home number and no answer. Patient scheduled for BP check next Wednesday July 28th and then has full postpartum visit on August 5th.  Patient has medicaid and can give her the number to  [facility] primary care as they accept medicaid. [id number]).  Unable to leave message. Armandina Stammer  RN

## 2020-01-27 NOTE — Telephone Encounter (Signed)
-----   Message from Willodean Rosenthal, MD sent at 01/26/2020  6:20 PM EDT ----- Please call pt. She needs to check her BP weekly.   She also need referral to primary care to assist with tobacco cessation.   Thx,  Clh-S

## 2020-01-29 ENCOUNTER — Other Ambulatory Visit: Payer: Self-pay | Admitting: Obstetrics and Gynecology

## 2020-01-29 DIAGNOSIS — Z720 Tobacco use: Secondary | ICD-10-CM

## 2020-01-29 DIAGNOSIS — O165 Unspecified maternal hypertension, complicating the puerperium: Secondary | ICD-10-CM

## 2020-01-29 IMAGING — US US OB COMP LESS 14 WK
1 series · 14 of 28 positions shown · non-contrast
Comparison: None.

CLINICAL DATA: Spotting, abdominal pain

EXAM:
OBSTETRIC <14 WK US AND TRANSVAGINAL OB US
TECHNIQUE: Both transabdominal and transvaginal ultrasound examinations were
performed for complete evaluation of the gestation as well as the
maternal uterus, adnexal regions, and pelvic cul-de-sac.
Transvaginal technique was performed to assess early pregnancy.

[Series 1: us ob comp less 14 wk · 145 acquisitions, 14 frames shown]
[im 6/145]
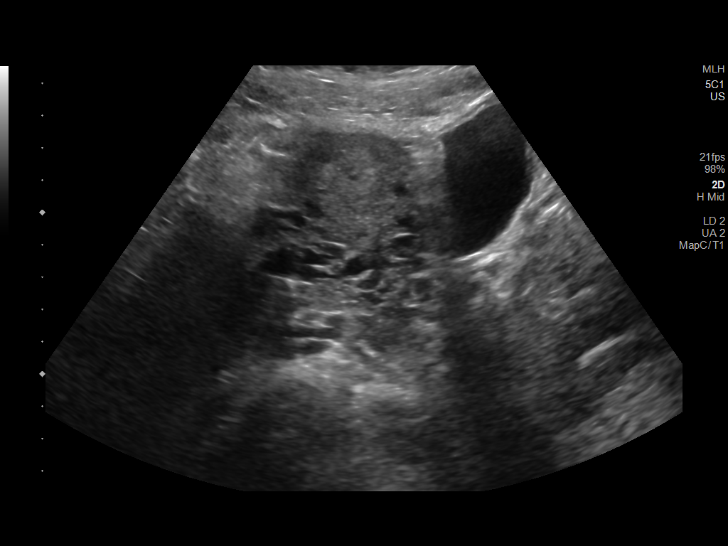
[im 17/145]
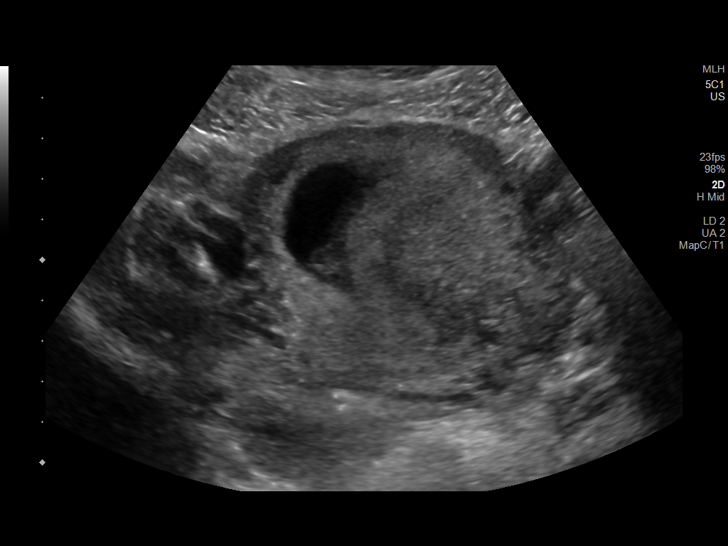
[im 27/145]
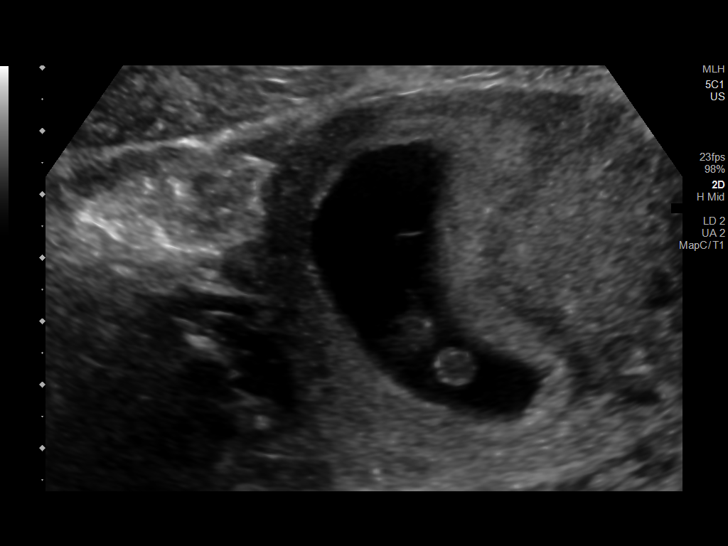
[im 38/145]
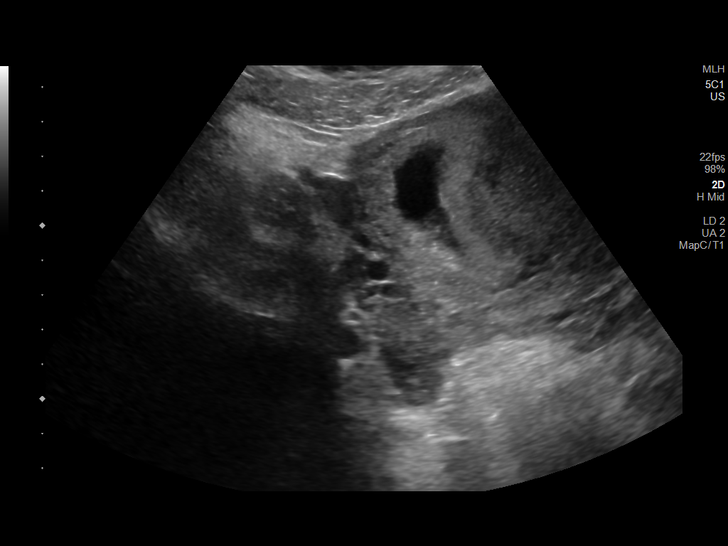
[im 49/145]
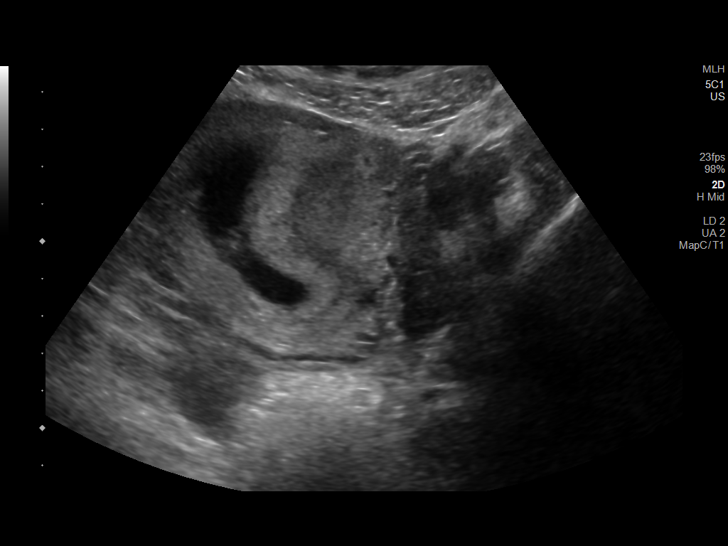
[im 59/145]
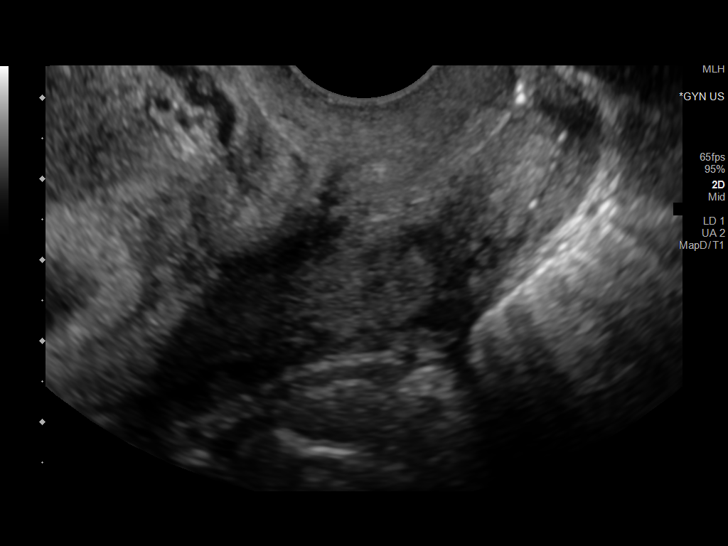
[im 70/145]
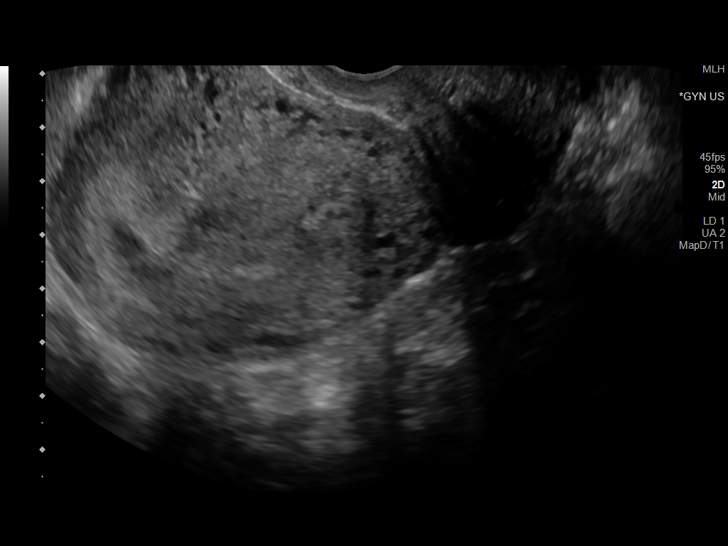
[im 81/145]
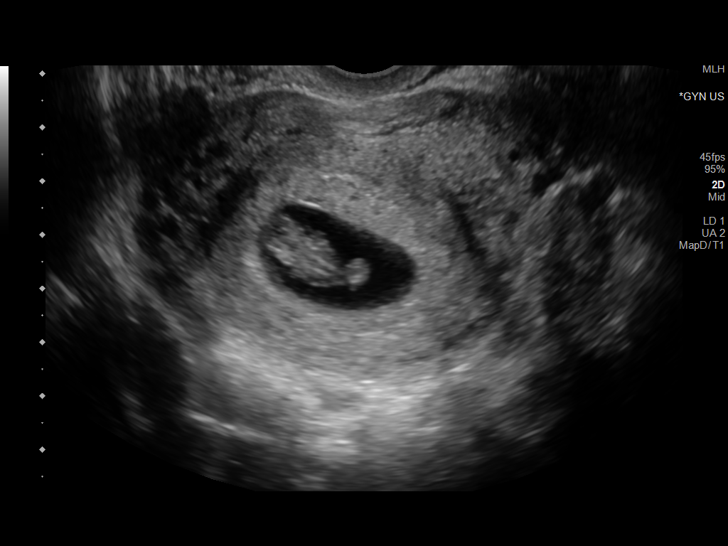
[im 91/145]
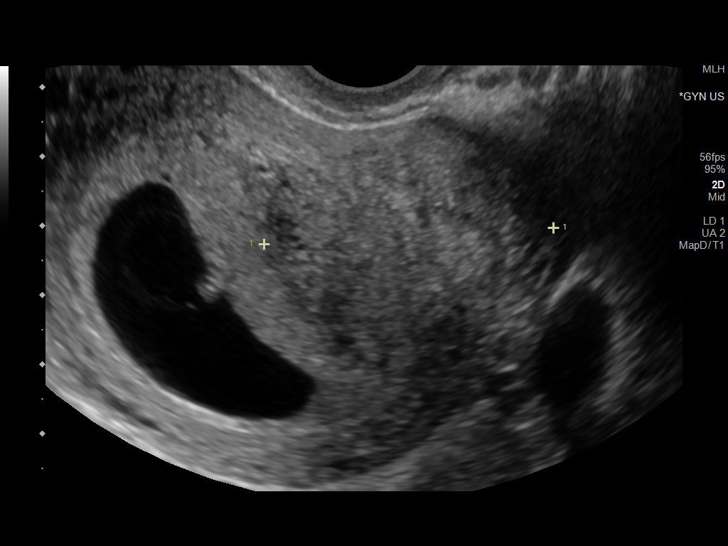
[im 102/145]
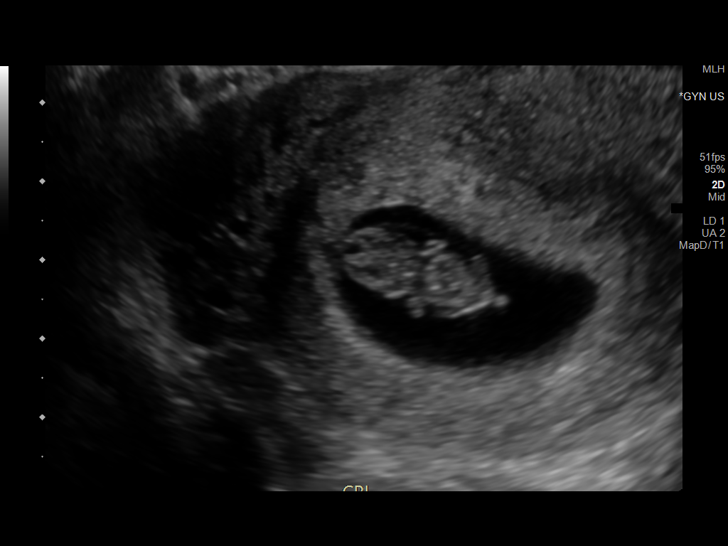
[im 113/145]
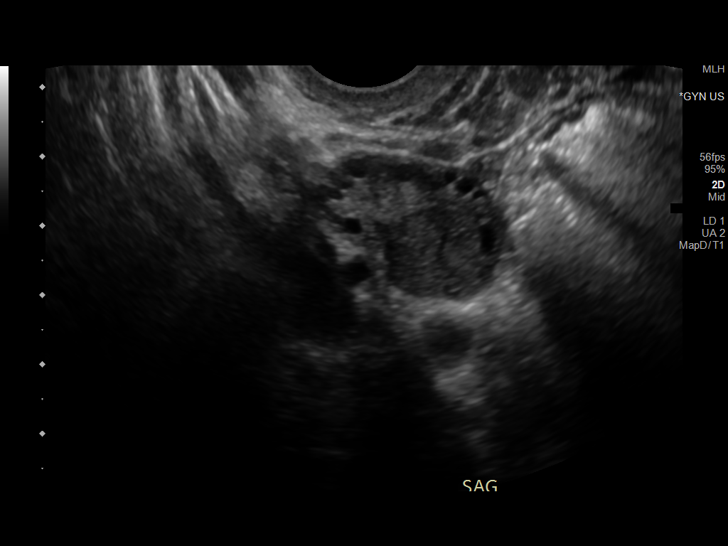
[im 123/145]
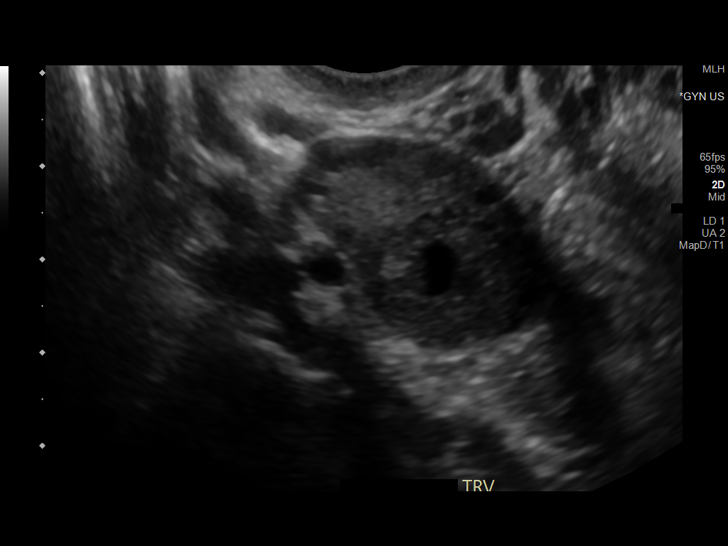
[im 134/145]
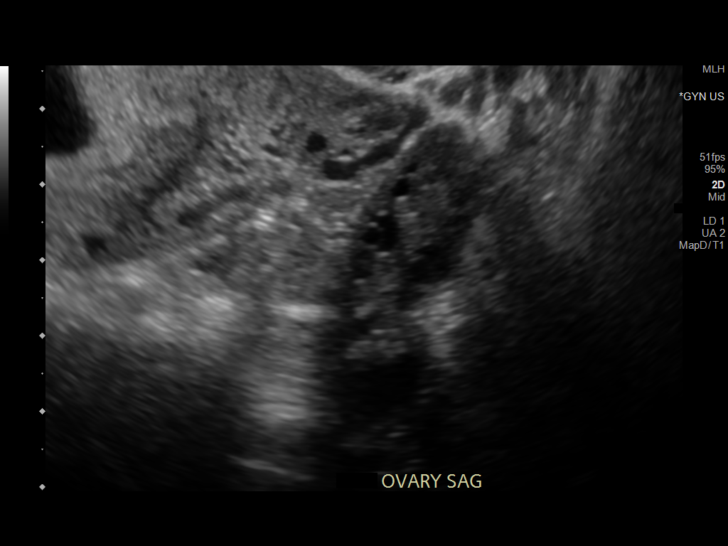
[im 145/145]
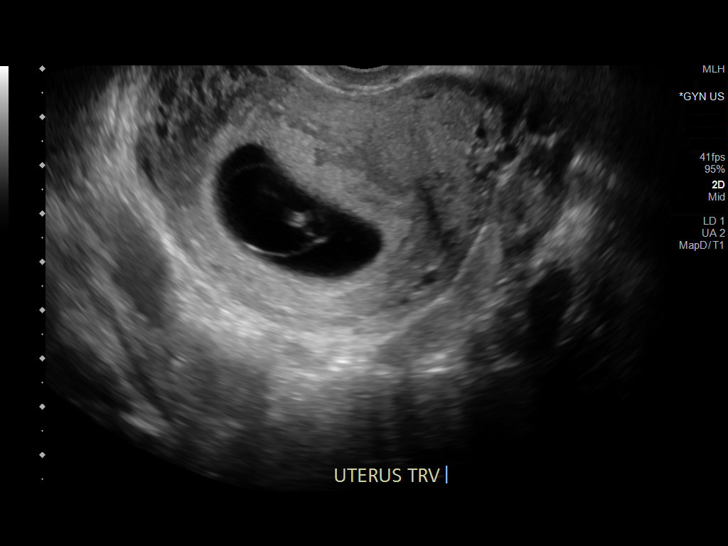

[14 of 28 positions shown; findings below may reference images not displayed]

FINDINGS: Intrauterine gestational sac: Single

Yolk sac:  Visualized.

Embryo:  Visualized.

Cardiac Activity: Visualized.

Heart Rate: 168 bpm

CRL:  20.7 mm   8 w   5 d                  US EDC: 01/14/2020

Subchorionic hemorrhage:  Small

Maternal uterus/adnexae: Question of a left fundal fibroid measuring
3.4 x 2.4 x 4.2 cm. Ovaries are unremarkable, noting corpus luteum
on the right. Trace free fluid.
IMPRESSION: Single viable intrauterine pregnancy as described.

Small subchorionic hemorrhage.

Possible left fundal fibroid.

Trace free fluid.

## 2020-01-29 MED ORDER — AMLODIPINE BESYLATE 5 MG PO TABS: 5.0000 mg | ORAL_TABLET | Freq: Every day | ORAL | 2 refills | Status: DC

## 2020-01-29 NOTE — Progress Notes (Signed)
norvasc sent to pharmacy 

## 2020-02-03 ENCOUNTER — Ambulatory Visit: Payer: Medicaid Other

## 2020-02-11 ENCOUNTER — Ambulatory Visit: Payer: Medicaid Other | Admitting: Family Medicine

## 2020-02-22 ENCOUNTER — Ambulatory Visit: Payer: Medicaid Other | Admitting: Family Medicine

## 2020-02-29 ENCOUNTER — Ambulatory Visit: Payer: Medicaid Other | Admitting: Family Medicine

## 2020-06-12 IMAGING — US US MFM OB FOLLOW-UP
1 series · 14 of 28 positions shown · non-contrast
Comparison: none

[Series 1: us mfm ob follow-up · 14 of 35 slices shown]
[im 2/35]
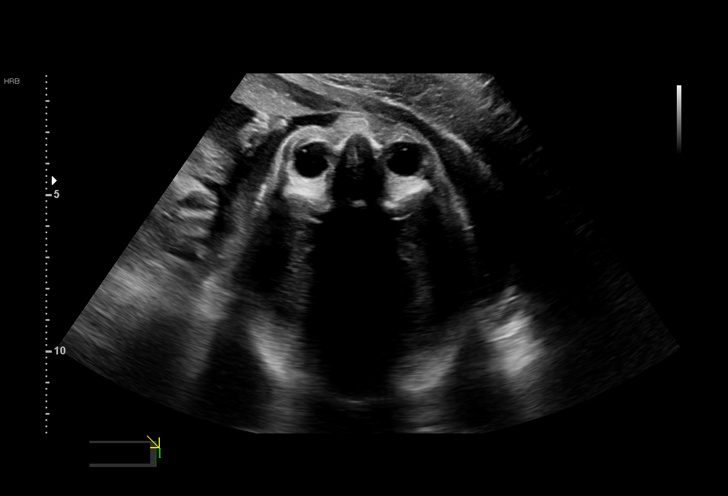
[im 4/35]
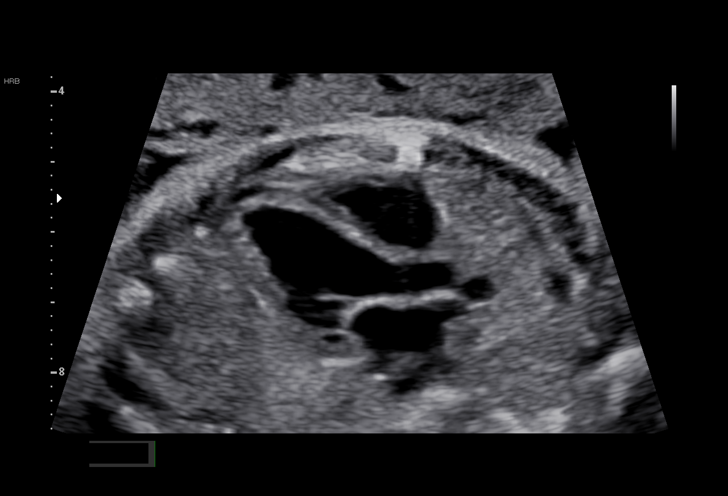
[im 7/35]
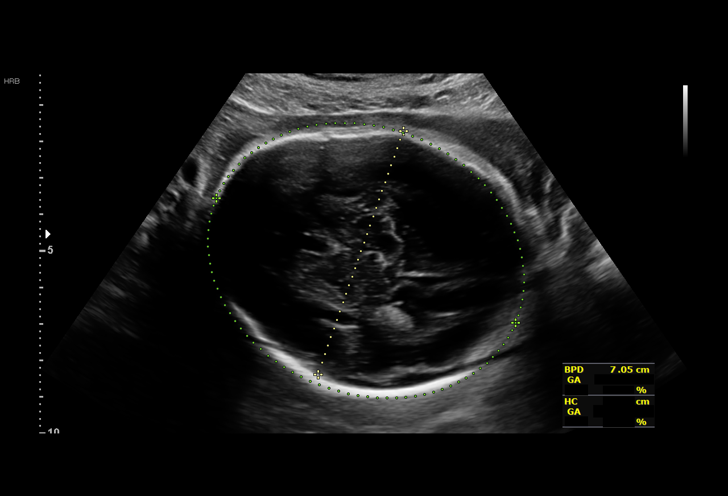
[im 9/35]
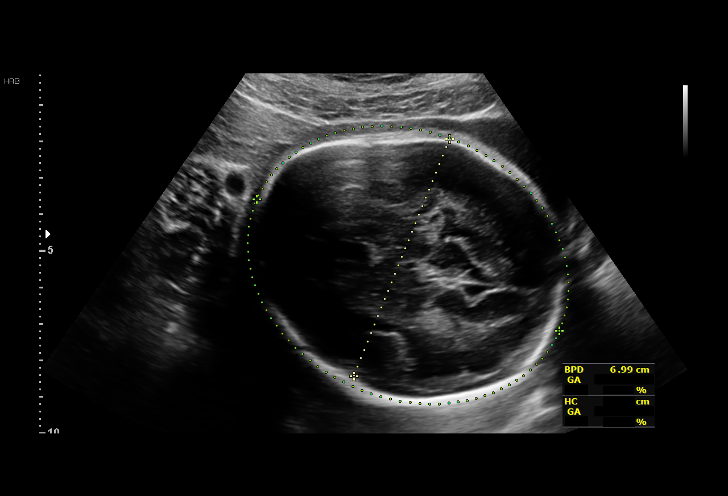
[im 12/35]
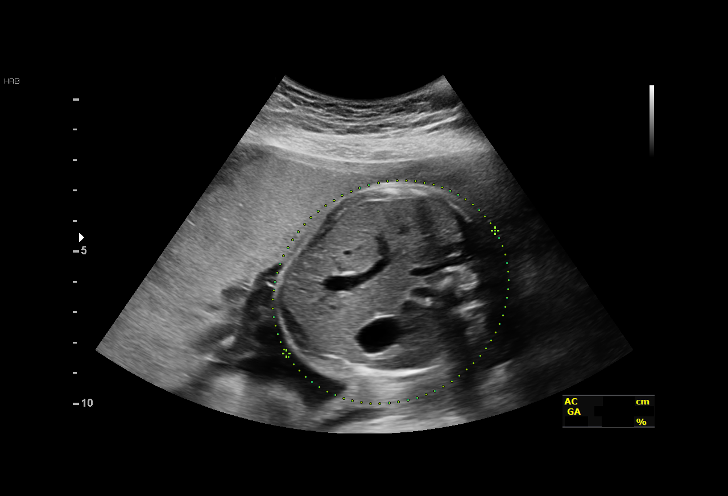
[im 14/35]
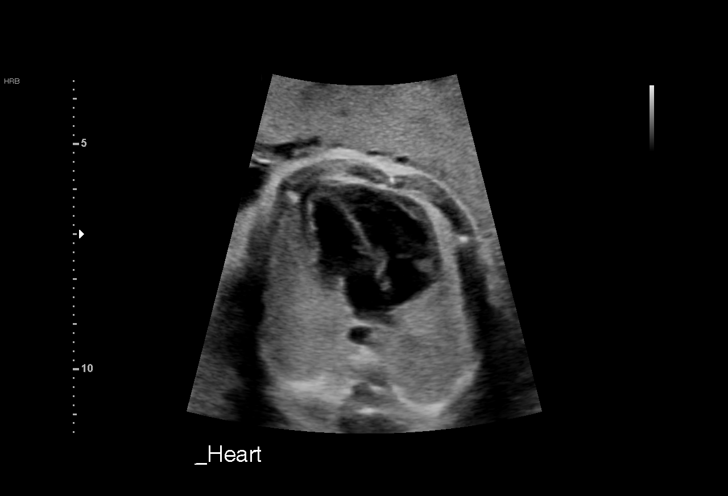
[im 17/35]
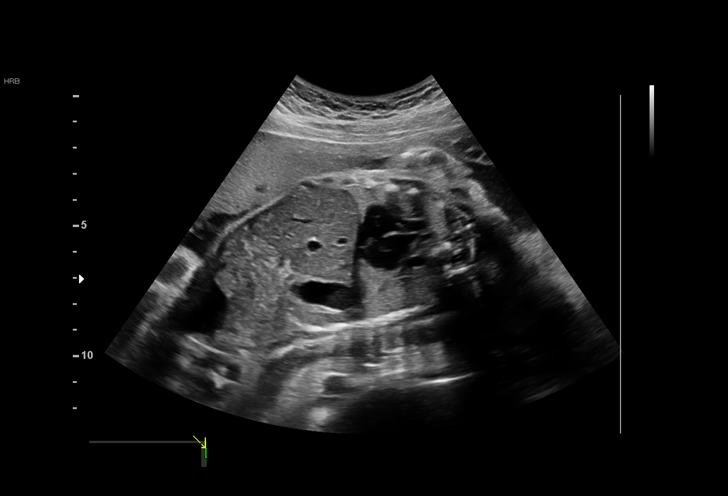
[im 19/35]
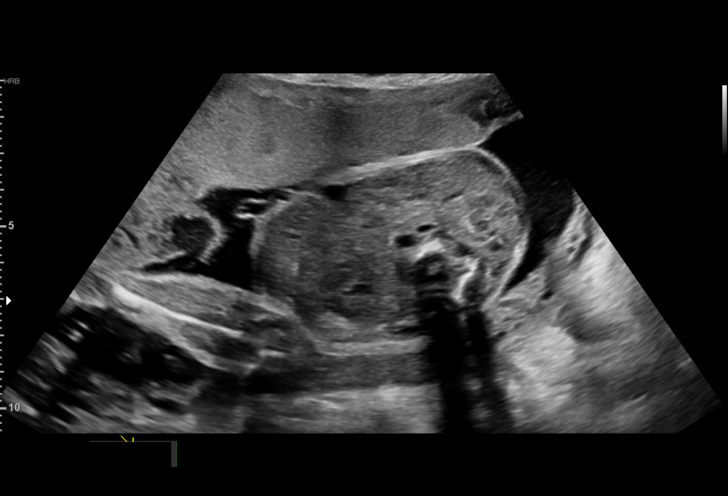
[im 22/35]
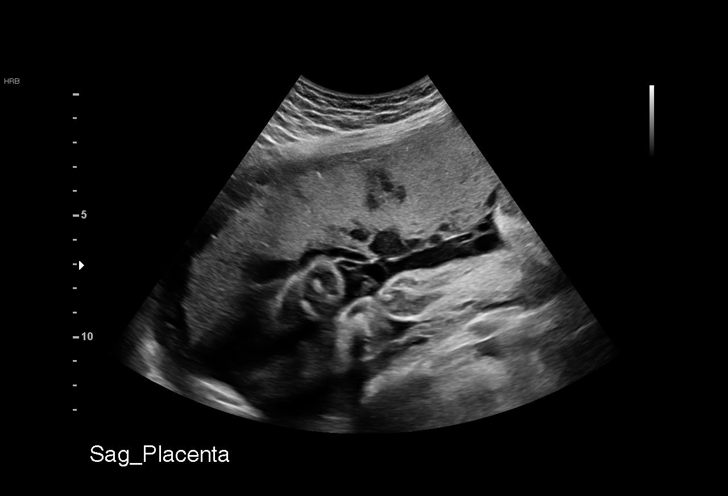
[im 24/35]
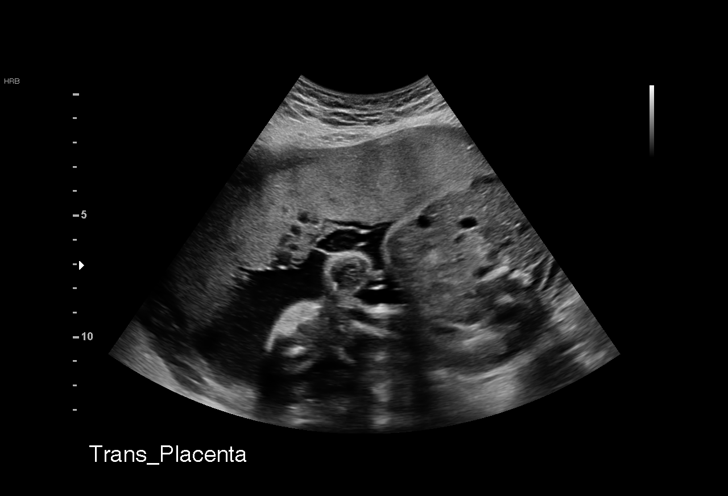
[im 27/35]
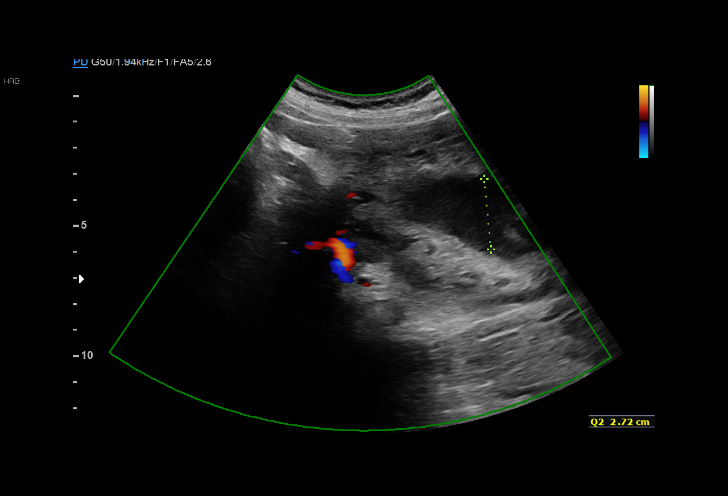
[im 29/35]
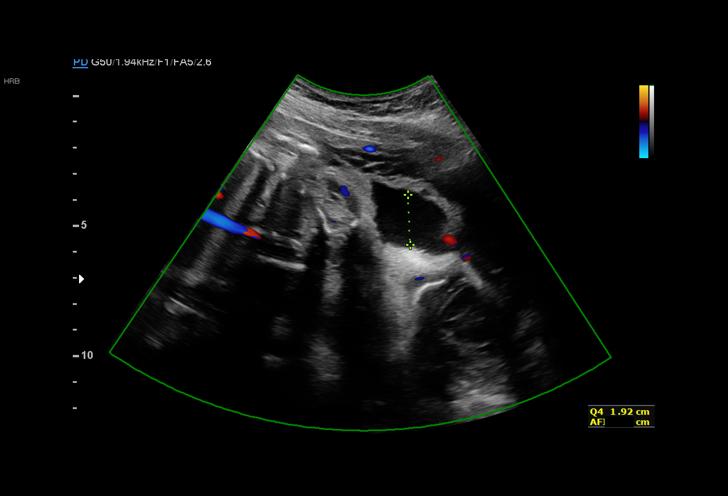
[im 32/35]
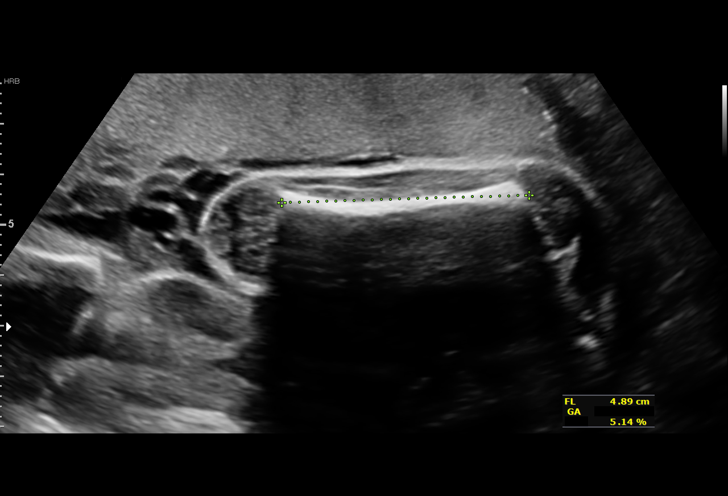
[im 35/35]
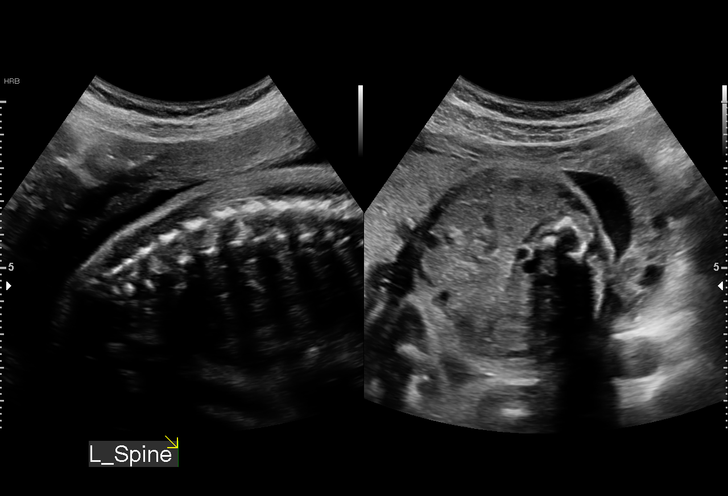

[14 of 28 positions shown; findings below may reference images not displayed]

----------------------------------------------------------------------

 ----------------------------------------------------------------------
Indications

  Previous cesarean delivery, antepartum x1
  Antenatal follow-up for nonvisualized fetal
  anatomy
  28 weeks gestation of pregnancy
 ----------------------------------------------------------------------
Fetal Evaluation

 Num Of Fetuses:         1
 Fetal Heart Rate(bpm):  146
 Cardiac Activity:       Observed
 Presentation:           Cephalic
 Placenta:               Anterior
 P. Cord Insertion:      Previously Visualized

 Amniotic Fluid
 AFI FV:      Within normal limits

 AFI Sum(cm)     %Tile       Largest Pocket(cm)
 9.9             9

 RUQ(cm)       RLQ(cm)       LUQ(cm)        LLQ(cm)

Biometry

 BPD:        70  mm     G. Age:  28w 1d         42  %    CI:        75.12   %    70 - 86
                                                         FL/HC:      19.1   %    18.8 -
 HC:      256.2  mm     G. Age:  27w 6d         16  %    HC/AC:      1.08        1.05 -
 AC:       237   mm     G. Age:  28w 0d         42  %    FL/BPD:     69.9   %    71 - 87
 FL:       48.9  mm     G. Age:  26w 3d          5  %    FL/AC:      20.6   %    20 - 24

 Est. FW:    2048  gm      2 lb 6 oz     20  %
OB History

 Gravidity:    3         Term:   1        Prem:   0        SAB:   1
 TOP:          0       Ectopic:  0        Living: 1
Gestational Age

 LMP:           28w 0d        Date:  04/09/19                 EDD:   01/14/20
 U/S Today:     27w 4d                                        EDD:   01/17/20
 Best:          28w 0d     Det. By:  LMP  (04/09/19)          EDD:   01/14/20
Anatomy

 Cranium:               Appears normal         Aortic Arch:            Previously seen
 Cavum:                 Appears normal         Ductal Arch:            Previously seen
 Ventricles:            Appears normal         Diaphragm:              Appears normal
 Choroid Plexus:        Previously seen        Stomach:                Appears normal, left
                                                                       sided
 Cerebellum:            Previously seen        Abdomen:                Appears normal
 Posterior Fossa:       Previously seen        Abdominal Wall:         Previously seen
 Nuchal Fold:           Not applicable (>20    Cord Vessels:           Previously seen
                        wks GA)
 Face:                  Appears normal         Kidneys:                Appear normal
                        (orbits and profile)
 Lips:                  Previously seen        Bladder:                Appears normal
 Thoracic:              Appears normal         Spine:                  Not well visualized
 Heart:                 Appears normal         Upper Extremities:      Previously seen
                        (4CH, axis, and
                        situs)
 RVOT:                  Previously seen        Lower Extremities:      Previously seen
 LVOT:                  Previously seen

 Other:  Heels visualized. Technically difficult due to fetal position.
Cervix Uterus Adnexa

 Cervix
 Not visualized (advanced GA >90wks)
Comments

 This patient was seen for a follow up exam to complete the
 views of the fetal anatomy.
 The fetal growth and amniotic fluid level appears appropriate
 for her gestational age.
 The views of the fetal spine was still not fully visualized today.

 She will return in 4 weeks for another exam.

## 2020-10-11 ENCOUNTER — Inpatient Hospital Stay (HOSPITAL_COMMUNITY)
Admission: AD | Admit: 2020-10-11 | Discharge: 2020-10-11 | Disposition: A | Discharge status: Home / Self Care | Payer: Medicaid Other | Attending: Obstetrics & Gynecology | Admitting: Obstetrics & Gynecology

## 2020-10-11 ENCOUNTER — Other Ambulatory Visit: Payer: Self-pay

## 2020-10-11 ENCOUNTER — Encounter (HOSPITAL_COMMUNITY): Payer: Self-pay | Admitting: Obstetrics & Gynecology

## 2020-10-11 ENCOUNTER — Inpatient Hospital Stay (HOSPITAL_COMMUNITY): Payer: Medicaid Other

## 2020-10-11 DIAGNOSIS — O26891 Other specified pregnancy related conditions, first trimester: Secondary | ICD-10-CM | POA: Insufficient documentation

## 2020-10-11 DIAGNOSIS — R1031 Right lower quadrant pain: Secondary | ICD-10-CM | POA: Insufficient documentation

## 2020-10-11 DIAGNOSIS — O209 Hemorrhage in early pregnancy, unspecified: Secondary | ICD-10-CM | POA: Diagnosis not present

## 2020-10-11 DIAGNOSIS — Z3A08 8 weeks gestation of pregnancy: Secondary | ICD-10-CM | POA: Insufficient documentation

## 2020-10-11 DIAGNOSIS — O99331 Smoking (tobacco) complicating pregnancy, first trimester: Secondary | ICD-10-CM | POA: Insufficient documentation

## 2020-10-11 DIAGNOSIS — O4690 Antepartum hemorrhage, unspecified, unspecified trimester: Secondary | ICD-10-CM

## 2020-10-11 DIAGNOSIS — F17210 Nicotine dependence, cigarettes, uncomplicated: Secondary | ICD-10-CM | POA: Diagnosis not present

## 2020-10-11 DIAGNOSIS — Z3A01 Less than 8 weeks gestation of pregnancy: Secondary | ICD-10-CM

## 2020-10-11 DIAGNOSIS — Z6790 Unspecified blood type, Rh positive: Secondary | ICD-10-CM

## 2020-10-11 LAB — CBC
HCT: 33.7 % — ABNORMAL LOW (ref 36.0–46.0)
Hemoglobin: 11.4 g/dL — ABNORMAL LOW (ref 12.0–15.0)
MCH: 30.6 pg (ref 26.0–34.0)
MCHC: 33.8 g/dL (ref 30.0–36.0)
MCV: 90.3 fL (ref 80.0–100.0)
Platelets: 334 10*3/uL (ref 150–400)
RBC: 3.73 MIL/uL — ABNORMAL LOW (ref 3.87–5.11)
RDW: 12.7 % (ref 11.5–15.5)
WBC: 7.4 10*3/uL (ref 4.0–10.5)
nRBC: 0 % (ref 0.0–0.2)

## 2020-10-11 LAB — URINALYSIS, ROUTINE W REFLEX MICROSCOPIC
Bilirubin Urine: NEGATIVE
Glucose, UA: NEGATIVE mg/dL
Hgb urine dipstick: NEGATIVE
Ketones, ur: NEGATIVE mg/dL
Nitrite: NEGATIVE
Protein, ur: NEGATIVE mg/dL
Specific Gravity, Urine: 1.015 (ref 1.005–1.030)
pH: 8 (ref 5.0–8.0)

## 2020-10-11 LAB — WET PREP, GENITAL
Sperm: NONE SEEN
Trich, Wet Prep: NONE SEEN
Yeast Wet Prep HPF POC: NONE SEEN

## 2020-10-11 LAB — POCT PREGNANCY, URINE: Preg Test, Ur: POSITIVE — AB

## 2020-10-11 LAB — HCG, QUANTITATIVE, PREGNANCY: hCG, Beta Chain, Quant, S: 150579 m[IU]/mL — ABNORMAL HIGH (ref <5)

## 2020-10-11 LAB — HIV ANTIBODY (ROUTINE TESTING W REFLEX): HIV Screen 4th Generation wRfx: NONREACTIVE

## 2020-10-11 NOTE — Discharge Instructions (Signed)
Abdominal Pain During Pregnancy Belly (abdominal) pain is common during pregnancy. There are many possible causes. Some causes are more serious than others. Sometimes the cause is not known. Always tell your doctor if you have belly pain. Follow these instructions at home:  Do not have sex or put anything in your vagina until your pain goes away completely.  Get plenty of rest until your pain gets better.  Drink enough fluid to keep your pee (urine) pale yellow.  Take over-the-counter and prescription medicines only as told by your doctor.  Keep all follow-up visits.   Contact a doctor if:  You keep having pain after resting.  Your pain gets worse after resting.  You have lower belly pain that: ? Comes and goes at regular times. ? Spreads to your back. ? Feels like menstrual cramps.  You have pain or burning when you pee (urinate). Get help right away if:  You have a fever or chills.  You feel like it is hard to breathe.  You have bleeding from your vagina.  You are leaking fluid or tissue from your vagina.  You vomit for more than 24 hours.  You have watery poop (diarrhea) for more than 24 hours.  Your baby is moving less than usual.  You feel very weak or faint.  You have very bad pain in your upper belly. Summary  Belly pain is common during pregnancy. There are many possible causes.  If you have belly pain during pregnancy, tell your doctor right away.  Keep all follow-up visits. This information is not intended to replace advice given to you by your health care provider. Make sure you discuss any questions you have with your health care provider. Document Revised: 03/08/2020 Document Reviewed: 03/08/2020 Elsevier Patient Education  2021 [employer]  

## 2020-10-11 NOTE — MAU Provider Note (Signed)
History     CSN: [id number]  Arrival date and time: 10/11/20 1438   Event Date/Time   First Provider Initiated Contact with Patient 10/11/20 [phone]      Chief Complaint  Patient presents with  . Abdominal Pain  . Vaginal Bleeding   30 y.o. [id number] @[redacted]w[redacted]d  presenting with spotting and RLQ pain. Reports onset of sx 2 weeks ago. RLQ pain feels like constant cramp. Rates pain 7/10. She took Ibuprofen and Tylenol which helped a little. Heat and having BM helps pain also. Denies fevers. Denies urinary sx. She only sees spotting when she wipes. She is also concerned she may have been reinfected with syphilis. Reports she and partner were treated a few years ago but he recently tested positive.   OB History    Gravida  4   Para  2   Term  2   Preterm      AB  1   Living  2     SAB  1   IAB      Ectopic      Multiple  0   Live Births  2           Past Medical History:  Diagnosis Date  . Medical history non-contributory   . Syphilis     Past Surgical History:  Procedure Laterality Date  . CESAREAN SECTION    . CESAREAN SECTION    . CESAREAN SECTION N/A 01/10/2020   Procedure: CESAREAN SECTION;  Surgeon: 03/12/2020, DO;  Location: [facility];  Service: Obstetrics;  Laterality: N/A;    Family History  Problem Relation Age of Onset  . Healthy Mother   . Healthy Father     Social History   Tobacco Use  . Smoking status: Current Every Day Smoker    Packs/day: 1.00    Types: Cigarettes  . Smokeless tobacco: Never Used  Substance Use Topics  . Alcohol use: Yes    Comment: occ  . Drug use: Not Currently    Types: Marijuana    Comment: last smoked January 2021    Allergies: No Known Allergies  Medications Prior to Admission  Medication Sig Dispense Refill Last Dose  . amLODipine (NORVASC) 5 MG tablet Take 1 tablet (5 mg total) by mouth daily. 30 tablet 2   . ferrous sulfate 325 (65 FE) MG tablet Take 1 tablet (325 mg total) by mouth daily with  breakfast. (Patient not taking: Reported on 01/22/2020) 60 tablet 0   . ibuprofen (ADVIL) 800 MG tablet Take 1 tablet (800 mg total) by mouth every 6 (six) hours. 30 tablet 0   . oxyCODONE (ROXICODONE) 5 MG immediate release tablet Take 1 tablet (5 mg total) by mouth every 4 (four) hours as needed for moderate pain. (Patient not taking: Reported on 01/20/2020) 24 tablet 0   . polyethylene glycol (MIRALAX / GLYCOLAX) 17 g packet Take 17 g by mouth daily. (Patient not taking: Reported on 01/22/2020) 14 each 0   . senna-docusate (SENOKOT-S) 8.6-50 MG tablet Take 2 tablets by mouth daily. (Patient not taking: Reported on 01/22/2020) 30 tablet 0     Review of Systems  Constitutional: Negative for chills and fever.  Gastrointestinal: Positive for abdominal pain.  Genitourinary: Positive for vaginal bleeding. Negative for dysuria.   Physical Exam   Blood pressure 121/70, pulse 91, temperature 98.8 F (37.1 C), temperature source Oral, resp. rate 16, height 5\' 5"  (1.651 m), weight 65 kg, last menstrual period 08/10/2020, SpO2 99 %,  not currently breastfeeding.  Physical Exam Vitals and nursing note reviewed.  Constitutional:      General: She is not in acute distress.    Appearance: Normal appearance.  HENT:     Head: Normocephalic and atraumatic.  Cardiovascular:     Rate and Rhythm: Normal rate.  Pulmonary:     Effort: Pulmonary effort is normal. No respiratory distress.  Abdominal:     General: There is no distension.     Palpations: Abdomen is soft. There is no mass.     Tenderness: There is no abdominal tenderness. There is no guarding or rebound.     Hernia: No hernia is present.  Musculoskeletal:        General: Normal range of motion.     Cervical back: Normal range of motion.  Skin:    General: Skin is warm and dry.  Neurological:     General: No focal deficit present.     Mental Status: She is alert and oriented to person, place, and time.  Psychiatric:        Mood and Affect:  Mood normal.        Behavior: Behavior normal.    Results for orders placed or performed during the hospital encounter of 10/11/20 (from the past 24 hour(s))  Pregnancy, urine POC     Status: Abnormal   Collection Time: 10/11/20  2:49 PM  Result Value Ref Range   Preg Test, Ur POSITIVE (A) NEGATIVE  Urinalysis, Routine w reflex microscopic Urine, Clean Catch     Status: Abnormal   Collection Time: 10/11/20  2:50 PM  Result Value Ref Range   Color, Urine YELLOW YELLOW   APPearance HAZY (A) CLEAR   Specific Gravity, Urine 1.015 1.005 - 1.030   pH 8.0 5.0 - 8.0   Glucose, UA NEGATIVE NEGATIVE mg/dL   Hgb urine dipstick NEGATIVE NEGATIVE   Bilirubin Urine NEGATIVE NEGATIVE   Ketones, ur NEGATIVE NEGATIVE mg/dL   Protein, ur NEGATIVE NEGATIVE mg/dL   Nitrite NEGATIVE NEGATIVE   Leukocytes,Ua TRACE (A) NEGATIVE   RBC / HPF 0-5 0 - 5 RBC/hpf   WBC, UA 6-10 0 - 5 WBC/hpf   Bacteria, UA RARE (A) NONE SEEN   Squamous Epithelial / LPF 6-10 0 - 5  CBC     Status: Abnormal   Collection Time: 10/11/20  3:25 PM  Result Value Ref Range   WBC 7.4 4.0 - 10.5 K/uL   RBC 3.73 (L) 3.87 - 5.11 MIL/uL   Hemoglobin 11.4 (L) 12.0 - 15.0 g/dL   HCT 54.0 (L) 08.6 - 76.1 %   MCV 90.3 80.0 - 100.0 fL   MCH 30.6 26.0 - 34.0 pg   MCHC 33.8 30.0 - 36.0 g/dL   RDW 95.0 93.2 - 67.1 %   Platelets 334 150 - 400 K/uL   nRBC 0.0 0.0 - 0.2 %  hCG, quantitative, pregnancy     Status: Abnormal   Collection Time: 10/11/20  3:25 PM  Result Value Ref Range   hCG, Beta Chain, Quant, S 150,579 (H) <5 mIU/mL  Wet prep, genital     Status: Abnormal   Collection Time: 10/11/20  3:36 PM   Specimen: PATH Cytology Cervicovaginal Ancillary Only  Result Value Ref Range   Yeast Wet Prep HPF POC NONE SEEN NONE SEEN   Trich, Wet Prep NONE SEEN NONE SEEN   Clue Cells Wet Prep HPF POC PRESENT (A) NONE SEEN   WBC, Wet Prep HPF POC FEW (A)  NONE SEEN   Sperm NONE SEEN    US OB Comp Less 14 Wks  Result Date:  10/11/2020 CLINICAL DATA:  Two weeks of right-sided abdominal cramping. EXAM: OBSTETRIC <14 WK ULTRASOUND TECHNIQUE: Transabdominal ultrasound was performed for evaluation of the gestation as well as the maternal uterus and adnexal regions. COMPARISON:  None. FINDINGS: Intrauterine gestational sac: Single Yolk sac:  Visualized. Embryo:  Visualized. Cardiac Activity: Visualized. Heart Rate: 171 bpm CRL: 12.5 mm mm   7 w 3 d                  [place] [facility]: May 27, 2021 Subchorionic hemorrhage:  None visualized. Maternal uterus/adnexae: Within normal limits IMPRESSION: Single viable intrauterine pregnancy as described above. Electronically Signed   By: Maudry Mayhew MD   On: 10/11/2020 16:38   MAU Course  Procedures  MDM Labs and [place] ordered and reviewed. No evidence of acute abd process. Normal IUP on [place], dating changed d/t 10 day difference. Pt reassurred. GC, RPR, and HIV pending. Stable for discharge home.   Assessment and Plan   1. [redacted] weeks gestation of pregnancy   2. Vaginal bleeding in pregnancy   3. Blood type, Rh positive    Discharge home Follow up at [facility] in 3 weeks to start care SAB precautions  Allergies as of 10/11/2020   No Known Allergies     Medication List    STOP taking these medications   amLODipine 5 MG tablet Commonly known as: Norvasc   ferrous sulfate 325 (65 FE) MG tablet   ibuprofen 800 MG tablet Commonly known as: ADVIL   oxyCODONE 5 MG immediate release tablet Commonly known as: Roxicodone   polyethylene glycol 17 g packet Commonly known as: MIRALAX / GLYCOLAX   senna-docusate 8.6-50 MG tablet Commonly known as: [employer], CNM 10/11/2020, 5:04 PM

## 2020-10-11 NOTE — MAU Note (Signed)
Christina Woodard is a 30 y.o. at [redacted]w[redacted]d here in MAU reporting: spotting for the past 2 weeks. States it is intermittent. Sees it when she wipes in the bathroom. Having cramping on the right side of her abdomen along her c/s incision, states the area feels swollen.  LMP: 08/10/20  Onset of complaint: ongoing  Pain score: 7/10  Vitals:   10/11/20 1456  BP: 121/70  Pulse: 91  Resp: 16  Temp: 98.8 F (37.1 C)  SpO2: 99%     Lab orders placed from triage: UA, UPT

## 2020-10-12 LAB — GC/CHLAMYDIA PROBE AMP (~~LOC~~) NOT AT ARMC
Chlamydia: NEGATIVE
Comment: NEGATIVE
Comment: NORMAL
Neisseria Gonorrhea: NEGATIVE

## 2020-10-12 LAB — RPR: RPR Ser Ql: NONREACTIVE

## 2020-11-11 ENCOUNTER — Encounter: Payer: Medicaid Other | Admitting: Family Medicine

## 2020-11-17 ENCOUNTER — Encounter: Payer: Medicaid Other | Admitting: Family Medicine

## 2020-11-21 ENCOUNTER — Encounter: Payer: Self-pay | Admitting: General Practice

## 2020-11-22 ENCOUNTER — Other Ambulatory Visit: Payer: Self-pay

## 2020-11-22 ENCOUNTER — Ambulatory Visit (INDEPENDENT_AMBULATORY_CARE_PROVIDER_SITE_OTHER): Payer: Medicaid Other | Admitting: Obstetrics and Gynecology

## 2020-11-22 VITALS — BP 104/63 | HR 86 | Wt 144.1 lb

## 2020-11-22 DIAGNOSIS — Z3A13 13 weeks gestation of pregnancy: Secondary | ICD-10-CM

## 2020-11-22 DIAGNOSIS — Z3480 Encounter for supervision of other normal pregnancy, unspecified trimester: Secondary | ICD-10-CM

## 2020-11-22 MED ORDER — CVS PRENATAL GUMMY 0.4 MG PO CHEW: 1.0000 | CHEWABLE_TABLET | Freq: Every day | ORAL | 3 refills | Status: DC

## 2020-11-22 NOTE — Progress Notes (Signed)
History:   TAKIERA MAYO is a 30 y.o. [id number] at [redacted]w[redacted]d by LMP being seen today for her first obstetrical visit.  Her obstetrical history is significant for smoker, previous C/s x 2, syphilis. Patient does intend to breast feed. Pregnancy history fully reviewed. Negative RPR in April.   Desires to quit smoking, would like to use Nicotine patch.   Patient reports no complaints.  HISTORY: OB History  Gravida Para Term Preterm AB Living  4 2 2  0 1 2  SAB IAB Ectopic Multiple Live Births  1 0 0 0 2    # Outcome Date GA Lbr Len/2nd Weight Sex Delivery Anes PTL Lv  4 Current           3 Term 01/10/20 [redacted]w[redacted]d  6 lb 7.5 oz (2.935 kg) F CS-LTranv Spinal  LIV     Name: ASAKO, SALIBA     Apgar1: 8  Apgar5: 9  2 SAB 2019          1 Term 2011 [redacted]w[redacted]d   M CS-LTranv EPI N LIV    Last pap smear was done 2021 and was normal  Past Medical History:  Diagnosis Date  . Medical history non-contributory   . Syphilis    Past Surgical History:  Procedure Laterality Date  . CESAREAN SECTION    . CESAREAN SECTION    . CESAREAN SECTION N/A 01/10/2020   Procedure: CESAREAN SECTION;  Surgeon: 03/12/2020, DO;  Location: [facility];  Service: Obstetrics;  Laterality: N/A;   Family History  Problem Relation Age of Onset  . Healthy Mother   . Healthy Father    Social History   Tobacco Use  . Smoking status: Current Every Day Smoker    Packs/day: 1.00    Types: Cigarettes  . Smokeless tobacco: Never Used  Substance Use Topics  . Alcohol use: Yes    Comment: occ  . Drug use: Not Currently    Types: Marijuana    Comment: last smoked January 2021   No Known Allergies No current outpatient medications on file prior to visit.   No current facility-administered medications on file prior to visit.    Review of Systems Pertinent items noted in HPI and remainder of comprehensive ROS otherwise negative.  Physical Exam:   Vitals:   11/22/20 1105  BP: 104/63  Pulse: 86  Weight:  144 lb 1.3 oz (65.4 kg)      General: well-developed, well-nourished female in no acute distress  Breasts:  normal appearance, no masses or tenderness bilaterally  Skin: normal coloration and turgor, no rashes  Neurologic: oriented, normal, negative, normal mood  Extremities: normal strength, tone, and muscle mass, ROM of all joints is normal  HEENT PERRLA, extraocular movement intact and sclera clear, anicteric  Neck supple and no masses  Cardiovascular: regular rate and rhythm  Respiratory:  no respiratory distress, normal breath sounds  Abdomen: soft, non-tender; bowel sounds normal; no masses,  no organomegaly  Pelvic: normal external genitalia, no lesions. GC/ wet prep collected. Pap not collected.     Assessment:    Pregnancy: 11/24/20 Patient Active Problem List   Diagnosis Date Noted  . Supervision of other normal pregnancy, antepartum 11/22/2020  . Status post cesarean section 01/10/2020  . History of cesarean delivery 09/11/2019  . History of syphilis 09/11/2019     Plan:   1. [redacted] weeks gestation of pregnancy  - Cervicovaginal ancillary only( [place]) - [facility] AMB BABYSCRIPTS SCHEDULE OPTIMIZATION  2.  Supervision of other normal pregnancy, antepartum  - Culture, OB Urine - [place] MFM OB DETAIL +14 WK; Future - Cervicovaginal ancillary only( [place]) - [facility] AMB BABYSCRIPTS SCHEDULE OPTIMIZATION - Patient not able to have labs drawn today, will return this week for lab work.  - Will have MD visit in the future to discuss repeat C/S.    Initial labs drawn. Continue prenatal vitamins. Problem list reviewed and updated. Genetic Screening discussed, NIPS: requested.- patient will return tomorrow for labs Ultrasound discussed; fetal anatomic survey: ordered. Anticipatory guidance about prenatal visits given including labs, ultrasounds, and testing. Discussed usage of Babyscripts and virtual visits as additional source of managing and completing prenatal visits in  midst of coronavirus and pandemic.   Encouraged to complete MyChart Registration for her ability to review results, send requests, and have questions addressed.  The nature of [place] [facility] Practice with multiple MDs and Advanced Practice Providers was explained to patient; also emphasized that residents, students are part of our team. Routine obstetric precautions reviewed. Encouraged to seek out care at office or emergency room [facility] MAU preferred) for urgent and/or emergent concerns. No follow-ups on file.     Mery Guadalupe, Harolyn Rutherford, NP Faculty Practice [facility] for [employer], [facility]

## 2020-11-23 LAB — CERVICOVAGINAL ANCILLARY ONLY
Bacterial Vaginitis (gardnerella): POSITIVE — AB
Candida Glabrata: NEGATIVE
Candida Vaginitis: POSITIVE — AB
Chlamydia: NEGATIVE
Comment: NEGATIVE
Comment: NEGATIVE
Comment: NEGATIVE
Comment: NEGATIVE
Comment: NORMAL
Neisseria Gonorrhea: NEGATIVE

## 2020-11-24 ENCOUNTER — Telehealth: Payer: Self-pay

## 2020-11-24 ENCOUNTER — Other Ambulatory Visit: Payer: Self-pay | Admitting: Obstetrics and Gynecology

## 2020-11-24 DIAGNOSIS — B9689 Other specified bacterial agents as the cause of diseases classified elsewhere: Secondary | ICD-10-CM

## 2020-11-24 DIAGNOSIS — B379 Candidiasis, unspecified: Secondary | ICD-10-CM

## 2020-11-24 MED ORDER — METRONIDAZOLE 500 MG PO TABS: 500.0000 mg | ORAL_TABLET | Freq: Two times a day (BID) | ORAL | 0 refills | Status: DC

## 2020-11-24 MED ORDER — TERCONAZOLE 0.4 % VA CREA: TOPICAL_CREAM | VAGINAL | 0 refills | Status: DC

## 2020-11-24 NOTE — Progress Notes (Signed)
Treatment per RN protocol.  Venia Carbon I, NP 11/24/2020 6:15 PM

## 2020-11-24 NOTE — Telephone Encounter (Signed)
Called pt to discuss positive BV and yeast results. Flagyl 500 mg BID x 7 days and Terconazole 1 applicator intravaginally QHS x 3 days was sent to her pharmacy. Understanding was voiced. Arron Tetrault l Nikita Humble, CMA

## 2020-11-25 ENCOUNTER — Ambulatory Visit: Payer: Medicaid Other

## 2020-11-26 LAB — CULTURE, OB URINE

## 2020-11-26 LAB — URINE CULTURE, OB REFLEX

## 2020-11-28 ENCOUNTER — Telehealth: Payer: Self-pay

## 2020-11-28 MED ORDER — CVS PRENATAL GUMMY 0.4 MG PO CHEW: 1.0000 | CHEWABLE_TABLET | Freq: Every day | ORAL | 3 refills | Status: AC

## 2020-11-28 MED ORDER — CEPHALEXIN 500 MG PO CAPS
500.0000 mg | ORAL_CAPSULE | Freq: Four times a day (QID) | ORAL | 0 refills | Status: DC
Start: 2020-11-28 — End: 2021-01-11

## 2020-11-28 NOTE — Telephone Encounter (Signed)
Called patient to inform her that urine culture came back showing that she had UTI -GBS +. Will need treatment. Will send in Keflex for patient. Also advised she needs to come in for prenatal labs this week. Rx for Keflex 500mg  qid x 7days sent into patient's pharmacy with no refills. Scheduled patient to come in on Wednesday for prenatal labs.

## 2020-11-29 ENCOUNTER — Encounter: Payer: Self-pay | Admitting: Obstetrics and Gynecology

## 2020-11-29 DIAGNOSIS — O2340 Unspecified infection of urinary tract in pregnancy, unspecified trimester: Secondary | ICD-10-CM | POA: Insufficient documentation

## 2020-11-29 DIAGNOSIS — B951 Streptococcus, group B, as the cause of diseases classified elsewhere: Secondary | ICD-10-CM | POA: Insufficient documentation

## 2020-11-30 ENCOUNTER — Other Ambulatory Visit: Payer: Self-pay

## 2020-11-30 ENCOUNTER — Ambulatory Visit: Payer: Medicaid Other

## 2020-11-30 DIAGNOSIS — Z3480 Encounter for supervision of other normal pregnancy, unspecified trimester: Secondary | ICD-10-CM

## 2020-11-30 DIAGNOSIS — Z3A14 14 weeks gestation of pregnancy: Secondary | ICD-10-CM

## 2020-12-01 LAB — CBC/D/PLT+RPR+RH+ABO+RUBIGG...
Antibody Screen: NEGATIVE
Basophils Absolute: 0 10*3/uL (ref 0.0–0.2)
Basos: 1 %
EOS (ABSOLUTE): 0.1 10*3/uL (ref 0.0–0.4)
Eos: 1 %
HCV Ab: <0.1 s/co ratio (ref 0.0–0.9)
HIV Screen 4th Generation wRfx: NONREACTIVE
Hematocrit: 34.9 % (ref 34.0–46.6)
Hemoglobin: 11.8 g/dL (ref 11.1–15.9)
Hepatitis B Surface Ag: NEGATIVE
Immature Grans (Abs): 0.1 10*3/uL (ref 0.0–0.1)
Immature Granulocytes: 1 %
Lymphocytes Absolute: 1.4 10*3/uL (ref 0.7–3.1)
Lymphs: 21 %
MCH: 30.4 pg (ref 26.6–33.0)
MCHC: 33.8 g/dL (ref 31.5–35.7)
MCV: 90 fL (ref 79–97)
Monocytes Absolute: 0.5 10*3/uL (ref 0.1–0.9)
Monocytes: 7 %
Neutrophils Absolute: 4.5 10*3/uL (ref 1.4–7.0)
Neutrophils: 69 %
Platelets: 247 10*3/uL (ref 150–450)
RBC: 3.88 x10E6/uL (ref 3.77–5.28)
RDW: 14.1 % (ref 11.7–15.4)
RPR Ser Ql: NONREACTIVE
Rh Factor: POSITIVE
Rubella Antibodies, IGG: 6.53 index (ref >0.99)
WBC: 6.5 10*3/uL (ref 3.4–10.8)

## 2020-12-01 LAB — HCV INTERPRETATION

## 2020-12-06 ENCOUNTER — Inpatient Hospital Stay (HOSPITAL_COMMUNITY)
Admission: AD | Admit: 2020-12-06 | Discharge: 2020-12-07 | Disposition: A | Discharge status: Home / Self Care | Payer: Medicaid Other | Attending: Obstetrics and Gynecology | Admitting: Obstetrics and Gynecology

## 2020-12-06 ENCOUNTER — Encounter (HOSPITAL_COMMUNITY): Payer: Self-pay | Admitting: Obstetrics and Gynecology

## 2020-12-06 DIAGNOSIS — O2242 Hemorrhoids in pregnancy, second trimester: Secondary | ICD-10-CM | POA: Diagnosis not present

## 2020-12-06 DIAGNOSIS — Z3A15 15 weeks gestation of pregnancy: Secondary | ICD-10-CM | POA: Insufficient documentation

## 2020-12-06 DIAGNOSIS — O2241 Hemorrhoids in pregnancy, first trimester: Secondary | ICD-10-CM

## 2020-12-06 DIAGNOSIS — O99612 Diseases of the digestive system complicating pregnancy, second trimester: Secondary | ICD-10-CM | POA: Diagnosis not present

## 2020-12-06 DIAGNOSIS — K5909 Other constipation: Secondary | ICD-10-CM

## 2020-12-06 DIAGNOSIS — Z87440 Personal history of urinary (tract) infections: Secondary | ICD-10-CM | POA: Insufficient documentation

## 2020-12-06 DIAGNOSIS — F17210 Nicotine dependence, cigarettes, uncomplicated: Secondary | ICD-10-CM | POA: Insufficient documentation

## 2020-12-06 DIAGNOSIS — Z3480 Encounter for supervision of other normal pregnancy, unspecified trimester: Secondary | ICD-10-CM

## 2020-12-06 DIAGNOSIS — R519 Headache, unspecified: Secondary | ICD-10-CM | POA: Insufficient documentation

## 2020-12-06 DIAGNOSIS — O99332 Smoking (tobacco) complicating pregnancy, second trimester: Secondary | ICD-10-CM | POA: Insufficient documentation

## 2020-12-06 DIAGNOSIS — O26892 Other specified pregnancy related conditions, second trimester: Secondary | ICD-10-CM | POA: Insufficient documentation

## 2020-12-06 DIAGNOSIS — K5900 Constipation, unspecified: Secondary | ICD-10-CM | POA: Diagnosis not present

## 2020-12-06 DIAGNOSIS — K649 Unspecified hemorrhoids: Secondary | ICD-10-CM | POA: Diagnosis not present

## 2020-12-06 LAB — URINALYSIS, ROUTINE W REFLEX MICROSCOPIC
Bilirubin Urine: NEGATIVE
Glucose, UA: NEGATIVE mg/dL
Hgb urine dipstick: NEGATIVE
Ketones, ur: NEGATIVE mg/dL
Leukocytes,Ua: NEGATIVE
Nitrite: NEGATIVE
Protein, ur: NEGATIVE mg/dL
Specific Gravity, Urine: 1.012 (ref 1.005–1.030)
pH: 7 (ref 5.0–8.0)

## 2020-12-06 MED ORDER — POLYETHYLENE GLYCOL 3350 17 G PO PACK
17.0000 g | PACK | Freq: Once | ORAL | Status: AC
  Administered 2020-12-06: 17 g via ORAL
  Filled 2020-12-06: qty 1

## 2020-12-06 MED ORDER — FLEET ENEMA 7-19 GM/118ML RE ENEM
1.0000 | ENEMA | Freq: Once | RECTAL | Status: AC
  Administered 2020-12-06: 1 via RECTAL

## 2020-12-06 NOTE — MAU Note (Signed)
Pt reports she had not had a BM x 4 days.  Pt has been on stool softeners and apple juice. Stated she strained today and had water/bloody stool. C/O abd pain and headache. Pt stated she feles something swollen in her anal area.

## 2020-12-06 NOTE — MAU Provider Note (Signed)
Chief Complaint: Constipation and Headache  SUBJECTIVE HPI: Christina Woodard is a 30 y.o. [id number] at [redacted]w[redacted]d by 7 week ultrasound who presents to maternity admissions reporting concern of constipation, rectal pain and headache. Pt reports that she is constipated at baseline, but that her symptoms have worsened with current pregnancy. Pt presented to the MAU tonight given lack of BM for the past 4 days. In addition she noticed a small amount of "what looked like blood" when she wiped. She is tolerating po intake well. Only mild nausea and intermittent emesis (last episode several days ago). No history of hemorrhoids. Pt reports that she has been drinking 4-5 coca-cola sodas daily with minimal water intake. Minimal fruits and vegetables in diet but reports access to these food via WIC. In regards to her headache, she reports that it seemed to "come on after staining to use the bathroom". She occasionally takes tylenol for headaches but did not take any medication prior to arrival to MAU. She reports taking a "daily stool softener that she was prescribed after her last Cesarean". However, on review of this medication bottle, she has actually been taking ferrous sulfate. Of note she is also taking keflex and flagyl for recently diagnosed UTI and BV. No vaginal bleeding, vaginal itching/burning, urinary symptoms, dizziness, or fever/chills.   Past Medical History:  Diagnosis Date  . Medical history non-contributory   . Syphilis    Past Surgical History:  Procedure Laterality Date  . CESAREAN SECTION    . CESAREAN SECTION    . CESAREAN SECTION N/A 01/10/2020   Procedure: CESAREAN SECTION;  Surgeon: Levie Heritage, DO;  Location: [facility] LD [facility]S;  Service: Obstetrics;  Laterality: N/A;   Social History   Socioeconomic History  . Marital status: Single    Spouse name: Not on file  . Number of children: Not on file  . Years of education: Not on file  . Highest education level: Not on file  Occupational  History  . Not on file  Tobacco Use  . Smoking status: Current Every Day Smoker    Packs/day: 1.00    Types: Cigarettes  . Smokeless tobacco: Never Used  Vaping Use  . Vaping Use: Not on file  Substance and Sexual Activity  . Alcohol use: Not Currently    Comment: occ  . Drug use: Not Currently    Types: Marijuana    Comment: last smoked January 2021  . Sexual activity: Yes    Birth control/protection: None  Other Topics Concern  . Not on file  Social History Narrative  . Not on file   Social Determinants of Health   Financial Resource Strain: Not on file  Food Insecurity: Not on file  Transportation Needs: Not on file  Physical Activity: Not on file  Stress: Not on file  Social Connections: Not on file  Intimate Partner Violence: Not on file   No current facility-administered medications on file prior to encounter.   Current Outpatient Medications on File Prior to Encounter  Medication Sig Dispense Refill  . cephALEXin (KEFLEX) 500 MG capsule Take 1 capsule (500 mg total) by mouth 4 (four) times daily. 28 capsule 0  . metroNIDAZOLE (FLAGYL) 500 MG tablet Take 1 tablet (500 mg total) by mouth 2 (two) times daily. 14 tablet 0  . Prenatal Multivit-Min-FA ([employer] PRENATAL GUMMY) 0.4 MG CHEW Chew 1 Dose by mouth daily. 90 tablet 3  . terconazole (TERAZOL 7) 0.4 % vaginal cream 1 applicator intravaginally QHS x 3 days. 45  g 0   No Known Allergies  ROS:  Review of Systems  Constitutional: Negative for appetite change, chills and fever.  HENT: Negative for congestion and sore throat.   Eyes: Negative for photophobia and visual disturbance.  Respiratory: Negative for cough and shortness of breath.   Cardiovascular: Negative for chest pain and leg swelling.  Gastrointestinal: Positive for blood in stool, constipation, nausea and rectal pain. Negative for abdominal pain, diarrhea and vomiting.  Endocrine: Negative for polyuria.  Genitourinary: Negative for dysuria, flank pain,  vaginal bleeding, vaginal discharge and vaginal pain.  Musculoskeletal: Negative for back pain and myalgias.  Neurological: Positive for headaches.   I have reviewed patient's Past Medical Hx, Surgical Hx, Family Hx, Social Hx, medications and allergies.   Physical Exam   Patient Vitals for the past 24 hrs:  BP Temp Pulse Resp Height Weight  12/07/20 0024 121/63 -- 68 18 -- --  12/06/20 2245 123/67 98 F (36.7 C) 80 18 5\' 5"  (1.651 m) 64.9 kg   Constitutional: Well-developed, well-nourished female in no acute distress.  Cardiovascular: normal rate Respiratory: normal effort GI: Abd soft, non-tender.  Rectum: Single non-thrombosed external hemorrhoid. No visible bleed on glove s/p rectal exam.  MS: Extremities nontender, no edema, normal ROM Neurologic: Alert and oriented x 4.   FHT 145 by doppler.  LAB RESULTS Results for orders placed or performed during the hospital encounter of 12/06/20 (from the past 24 hour(s))  Urinalysis, Routine w reflex microscopic Urine, Clean Catch     Status: None   Collection Time: 12/06/20 10:47 PM  Result Value Ref Range   Color, Urine YELLOW YELLOW   APPearance CLEAR CLEAR   Specific Gravity, Urine 1.012 1.005 - 1.030   pH 7.0 5.0 - 8.0   Glucose, UA NEGATIVE NEGATIVE mg/dL   Hgb urine dipstick NEGATIVE NEGATIVE   Bilirubin Urine NEGATIVE NEGATIVE   Ketones, ur NEGATIVE NEGATIVE mg/dL   Protein, ur NEGATIVE NEGATIVE mg/dL   Nitrite NEGATIVE NEGATIVE   Leukocytes,Ua NEGATIVE NEGATIVE    O/Positive/-- (05/25 1054)  IMAGING No results found.  MAU Management/MDM: Orders Placed This Encounter  Procedures  . Urinalysis, Routine w reflex microscopic Urine, Clean Catch  . Discharge patient Discharge disposition: 01-Home or Self Care; Discharge patient date: 12/07/2020    Meds ordered this encounter  Medications  . sodium phosphate (FLEET) 7-19 GM/118ML enema 1 enema  . polyethylene glycol (MIRALAX / GLYCOLAX) packet 17 g  . polyethylene  glycol (MIRALAX MIX-IN PAX) 17 g packet    Sig: Take 17 g by mouth daily as needed for mild constipation or moderate constipation.    Dispense:  14 each    Refill:  1  . glycerin adult 2 g suppository    Sig: Place 1 suppository rectally daily as needed for constipation.    Dispense:  12 suppository    Refill:  0     Treatments in MAU included: fleets enema, miralax. Pt discharged with strict return precautions for worsening pain, inability to tolerate po intake, severe abdominal pain or other concerns.  Reassuringly, improvement of symptoms and moderate stool output s/p fleets enema. Pt desires discharge home at this time.  ASSESSMENT & PLAN:  Christina Woodard is a 30 y.o. 26 at 106w3d by 7 week ultrasound who presents to maternity admissions reporting concern of constipation, rectal pain and acute headache.  1. Supervision of other normal pregnancy, antepartum: S/p first prenatal appt on 5/17. -plan to f/u with Dr. 6/17 on 6/15 or  sooner as needed  2. Other constipation: Notable improvement in symptoms and +stool output s/p enema.  -discussed importance of good hydration (water and NOT sugary drinks) in addition to increased fiber intake -provided script for miralax and glycerin suppositories prn to achieve 1-2 stool BMs daily -strict return precautions as noted above  3. Hemorrhoids during pregnancy in first trimester: Single non-thrombosed external hemorrhoid visible on exam. No surrounding erythema or irritation noted. -recommended regimen for constipation prevention as noted above -encouraged prn use of zinc oxide for barrier protection and sitz bathes as needed for discomfort  4. Acute nonintractable headache, unspecified headache type: Pt reports acute headache in the setting of straining with defecation. Mild discomfort on arrival. No red flag symptoms.  -emphasized importance of good hydration, sleep hygiene and decrease intake of caffeine (especially in the form of sugary  drinks) -recommended use of tylenol as needed   Discharge home with plan to follow-up with Dr. Adrian Blackwater on 6/15 as previously scheduled or sooner as needed for return precautions as noted above. Allergies as of 12/07/2020   No Known Allergies     Medication List    TAKE these medications   cephALEXin 500 MG capsule Commonly known as: KEFLEX Take 1 capsule (500 mg total) by mouth 4 (four) times daily.   CVS Prenatal Gummy 0.4 MG Chew Chew 1 Dose by mouth daily.   glycerin adult 2 g suppository Place 1 suppository rectally daily as needed for constipation.   metroNIDAZOLE 500 MG tablet Commonly known as: FLAGYL Take 1 tablet (500 mg total) by mouth 2 (two) times daily.   polyethylene glycol 17 g packet Commonly known as: MiraLax Mix-In Pax Take 17 g by mouth daily as needed for mild constipation or moderate constipation.   terconazole 0.4 % vaginal cream Commonly known as: TERAZOL 7 1 applicator intravaginally QHS x 3 days.       Sheila Oats, MD OB Fellow, Faculty Practice 12/07/2020 4:48 AM

## 2020-12-07 DIAGNOSIS — K649 Unspecified hemorrhoids: Secondary | ICD-10-CM

## 2020-12-07 DIAGNOSIS — Z3A15 15 weeks gestation of pregnancy: Secondary | ICD-10-CM

## 2020-12-07 DIAGNOSIS — O2242 Hemorrhoids in pregnancy, second trimester: Secondary | ICD-10-CM

## 2020-12-07 DIAGNOSIS — K5900 Constipation, unspecified: Secondary | ICD-10-CM

## 2020-12-07 DIAGNOSIS — O99612 Diseases of the digestive system complicating pregnancy, second trimester: Secondary | ICD-10-CM

## 2020-12-07 MED ORDER — POLYETHYLENE GLYCOL 3350 17 G PO PACK: 17.0000 g | PACK | Freq: Every day | ORAL | 1 refills | Status: DC | PRN

## 2020-12-07 MED ORDER — GLYCERIN (ADULT) 2 G RE SUPP: 1.0000 | Freq: Every day | RECTAL | 0 refills | Status: DC | PRN

## 2020-12-07 NOTE — Discharge Instructions (Signed)
-Drink plenty of water and increase the amount of fiber in your diet with lots of fruits and vegetables. -Take miralax daily as needed for constipation with the goal of 1-2 soft stools daily. -You may also take a glycerine suppository daily as needed for constipation. -Use zinc oxide (desitin) cream for hemorrhoids in addition to warm water soaks. -Try to limit sugary drinks. It is likely that you headaches are related to caffeine withdrawal.  -You may take tylenol 650mg  every 6 hours for headache. It is also important to drink plenty of water and get good sleep at night to prevent worsening headaches.  Constipation, Adult Constipation is when a person has trouble pooping (having a bowel movement). When you have this condition, you may poop fewer than 3 times a week. Your poop (stool) may also be dry, hard, or bigger than normal. Follow these instructions at home: Eating and drinking  Eat foods that have a lot of fiber, such as: ? Fresh fruits and vegetables. ? Whole grains. ? Beans.  Eat less of foods that are low in fiber and high in fat and sugar, such as: ? fries. ? Hamburgers. ? Cookies. ? Candy. ? Soda.  Drink enough fluid to keep your pee (urine) pale yellow.   General instructions  Exercise regularly or as told by your doctor. Try to do 150 minutes of exercise each week.  Go to the restroom when you feel like you need to poop. Do not hold it in.  Take over-the-counter and prescription medicines only as told by your doctor. These include any fiber supplements.  When you poop: ? Do deep breathing while relaxing your lower belly (abdomen). ? Relax your pelvic floor. The pelvic floor is a group of muscles that support the rectum, bladder, and intestines (as well as the uterus in women).  Watch your condition for any changes. Tell your doctor if you notice any.  Keep all follow-up visits as told by your doctor. This is important. Contact a doctor if:  You have pain  that gets worse.  You have a fever.  You have not pooped for 4 days.  You vomit.  You are not hungry.  You lose weight.  You are bleeding from the opening of the butt (anus).  You have thin, pencil-like poop. Get help right away if:  You have a fever, and your symptoms suddenly get worse.  You leak poop or have blood in your poop.  Your belly feels hard or bigger than normal (bloated).  You have very bad belly pain.  You feel dizzy or you faint. Summary  Constipation is when a person poops fewer than 3 times a week, has trouble pooping, or has poop that is dry, hard, or bigger than normal.  Eat foods that have a lot of fiber.  Drink enough fluid to keep your pee (urine) pale yellow.  Take over-the-counter and prescription medicines only as told by your doctor. These include any fiber supplements. This information is not intended to replace advice given to you by your health care provider. Make sure you discuss any questions you have with your health care provider. Document Revised: 05/13/2019 Document Reviewed: 05/13/2019 Elsevier Patient Education  2021 [employer]   Hemorrhoids Hemorrhoids are swollen veins in and around the rectum or anus. There are two types of hemorrhoids:  Internal hemorrhoids. These occur in the veins that are just inside the rectum. They may poke through to the outside and become irritated and painful.  External hemorrhoids.  These occur in the veins that are outside the anus and can be felt as a painful swelling or hard lump near the anus. Most hemorrhoids do not cause serious problems, and they can be managed with home treatments such as diet and lifestyle changes. If home treatments do not help the symptoms, procedures can be done to shrink or remove the hemorrhoids. What are the causes? This condition is caused by increased pressure in the anal area. This pressure may result from various things, including:  Constipation.  Straining to  have a bowel movement.  Diarrhea.  Pregnancy.  Obesity.  Sitting for long periods of time.  Heavy lifting or other activity that causes you to strain.  Anal sex.  Riding a bike for a long period of time. What are the signs or symptoms? Symptoms of this condition include:  Pain.  Anal itching or irritation.  Rectal bleeding.  Leakage of stool (feces).  Anal swelling.  One or more lumps around the anus. How is this diagnosed? This condition can often be diagnosed through a visual exam. Other exams or tests may also be done, such as:  An exam that involves feeling the rectal area with a gloved hand (digital rectal exam).  An exam of the anal canal that is done using a small tube (anoscope).  A blood test, if you have lost a significant amount of blood.  A test to look inside the colon using a flexible tube with a camera on the end (sigmoidoscopy or colonoscopy). How is this treated? This condition can usually be treated at home. However, various procedures may be done if dietary changes, lifestyle changes, and other home treatments do not help your symptoms. These procedures can help make the hemorrhoids smaller or remove them completely. Some of these procedures involve surgery, and others do not. Common procedures include:  Rubber band ligation. Rubber bands are placed at the base of the hemorrhoids to cut off their blood supply.  Sclerotherapy. Medicine is injected into the hemorrhoids to shrink them.  Infrared coagulation. A type of light energy is used to get rid of the hemorrhoids.  Hemorrhoidectomy surgery. The hemorrhoids are surgically removed, and the veins that supply them are tied off.  Stapled hemorrhoidopexy surgery. The surgeon staples the base of the hemorrhoid to the rectal wall. Follow these instructions at home: Eating and drinking  Eat foods that have a lot of fiber in them, such as whole grains, beans, nuts, fruits, and vegetables.  Ask your  health care provider about taking products that have added fiber (fiber supplements).  Reduce the amount of fat in your diet. You can do this by eating low-fat dairy products, eating less red meat, and avoiding processed foods.  Drink enough fluid to keep your urine pale yellow.   Managing pain and swelling  Take warm sitz baths for 20 minutes, 3-4 times a day to ease pain and discomfort. You may do this in a bathtub or using a portable sitz bath that fits over the toilet.  If directed, apply ice to the affected area. Using ice packs between sitz baths may be helpful. ? Put ice in a plastic bag. ? Place a towel between your skin and the bag. ? Leave the ice on for 20 minutes, 2-3 times a day.   General instructions  Take over-the-counter and prescription medicines only as told by your health care provider.  Use medicated creams or suppositories as told.  Get regular exercise. Ask your health care provider how  much and what kind of exercise is best for you. In general, you should do moderate exercise for at least 30 minutes on most days of the week (150 minutes each week). This can include activities such as walking, biking, or yoga.  Go to the bathroom when you have the urge to have a bowel movement. Do not wait.  Avoid straining to have bowel movements.  Keep the anal area dry and clean. Use wet toilet paper or moist towelettes after a bowel movement.  Do not sit on the toilet for long periods of time. This increases blood pooling and pain.  Keep all follow-up visits as told by your health care provider. This is important. Contact a health care provider if you have:  Increasing pain and swelling that are not controlled by treatment or medicine.  Difficulty having a bowel movement, or you are unable to have a bowel movement.  Pain or inflammation outside the area of the hemorrhoids. Get help right away if you have:  Uncontrolled bleeding from your  rectum. Summary  Hemorrhoids are swollen veins in and around the rectum or anus.  Most hemorrhoids can be managed with home treatments such as diet and lifestyle changes.  Taking warm sitz baths can help ease pain and discomfort.  In severe cases, procedures or surgery can be done to shrink or remove the hemorrhoids. This information is not intended to replace advice given to you by your health care provider. Make sure you discuss any questions you have with your health care provider. Document Revised: 11/21/2018 Document Reviewed: 11/14/2017 Elsevier Patient Education  2021 [employer].

## 2020-12-08 ENCOUNTER — Telehealth: Payer: Self-pay

## 2020-12-08 NOTE — Telephone Encounter (Signed)
Called pt to discuss Panorama results. Pt made aware that her results are low risk. Pt states she will pick up a copy of her results during next office visit. Understanding was voiced. Christina Woodard l [name], CMA

## 2020-12-09 ENCOUNTER — Encounter: Payer: Self-pay | Admitting: General Practice

## 2020-12-21 ENCOUNTER — Encounter: Payer: Medicaid Other | Admitting: Family Medicine

## 2020-12-23 ENCOUNTER — Encounter: Payer: Self-pay | Admitting: General Practice

## 2021-01-02 ENCOUNTER — Ambulatory Visit: Payer: Medicaid Other | Attending: Obstetrics and Gynecology

## 2021-01-02 ENCOUNTER — Ambulatory Visit: Payer: Medicaid Other

## 2021-01-11 ENCOUNTER — Other Ambulatory Visit: Payer: Self-pay

## 2021-01-11 ENCOUNTER — Ambulatory Visit (INDEPENDENT_AMBULATORY_CARE_PROVIDER_SITE_OTHER): Payer: Medicaid Other | Admitting: Family Medicine

## 2021-01-11 VITALS — BP 115/68 | HR 108 | Wt 156.0 lb

## 2021-01-11 DIAGNOSIS — B951 Streptococcus, group B, as the cause of diseases classified elsewhere: Secondary | ICD-10-CM

## 2021-01-11 DIAGNOSIS — Z3480 Encounter for supervision of other normal pregnancy, unspecified trimester: Secondary | ICD-10-CM

## 2021-01-11 DIAGNOSIS — Z98891 History of uterine scar from previous surgery: Secondary | ICD-10-CM

## 2021-01-11 DIAGNOSIS — O2342 Unspecified infection of urinary tract in pregnancy, second trimester: Secondary | ICD-10-CM

## 2021-01-11 NOTE — Progress Notes (Signed)
   PRENATAL VISIT NOTE  Subjective:  Christina Woodard is a 30 y.o. [id number] at [redacted]w[redacted]d being seen today for ongoing prenatal care.  She is currently monitored for the following issues for this low-risk pregnancy and has History of cesarean delivery; History of syphilis; Status post cesarean section; Supervision of other normal pregnancy, antepartum; and GBS (group B streptococcus) UTI complicating pregnancy on their problem list.  Patient reports no complaints.  Contractions: Not present. Vag. Bleeding: None.  Movement: Present. Denies leaking of fluid.   The following portions of the patient's history were reviewed and updated as appropriate: allergies, current medications, past family history, past medical history, past social history, past surgical history and problem list.   Objective:   Vitals:   01/11/21 1403  BP: 115/68  Pulse: (!) 108  Weight: 156 lb (70.8 kg)    Fetal Status: Fetal Heart Rate (bpm): 142   Movement: Present     General:  Alert, oriented and cooperative. Patient is in no acute distress.  Skin: Skin is warm and dry. No rash noted.   Cardiovascular: Normal heart rate noted  Respiratory: Normal respiratory effort, no problems with respiration noted  Abdomen: Soft, gravid, appropriate for gestational age.  Pain/Pressure: Absent     Pelvic: Cervical exam deferred        Extremities: Normal range of motion.  Edema: None  Mental Status: Normal mood and affect. Normal behavior. Normal judgment and thought content.   Assessment and Plan:  Pregnancy: [id number] at [redacted]w[redacted]d 1. Supervision of other normal pregnancy, antepartum FHT and FH normal. Discussed smoking cessation. Going to use patches  2. Group B Streptococcus urinary tract infection affecting pregnancy in second trimester Intrapartum ppx  3. History of cesarean delivery Contemplating RLTCS with BTL  Preterm labor symptoms and general obstetric precautions including but not limited to vaginal bleeding,  contractions, leaking of fluid and fetal movement were reviewed in detail with the patient. Please refer to After Visit Summary for other counseling recommendations.   No follow-ups on file.  Future Appointments  Date Time Provider Department Center  01/17/2021  9:55 AM Aviva Signs, CNM [facility] None    Levie Heritage, DO

## 2021-01-11 NOTE — Progress Notes (Signed)
+   Fetal movement. No complaints.  

## 2021-01-17 ENCOUNTER — Encounter: Payer: Medicaid Other | Admitting: Advanced Practice Midwife

## 2021-02-17 ENCOUNTER — Encounter: Payer: Medicaid Other | Admitting: Family Medicine

## 2021-02-22 ENCOUNTER — Encounter: Payer: Medicaid Other | Admitting: Family Medicine

## 2021-03-08 ENCOUNTER — Encounter: Payer: Medicaid Other | Admitting: Family Medicine

## 2021-03-09 ENCOUNTER — Encounter: Payer: Self-pay | Admitting: General Practice

## 2021-03-16 ENCOUNTER — Telehealth: Payer: Self-pay | Admitting: General Practice

## 2021-03-16 NOTE — Telephone Encounter (Signed)
Left message on VM for patient to contact our office to schedule follow up visit.  Pt will need to complete 2hr gtts testing.

## 2021-03-20 ENCOUNTER — Inpatient Hospital Stay (HOSPITAL_COMMUNITY)
Admission: AD | Admit: 2021-03-20 | Discharge: 2021-03-21 | Disposition: A | Discharge status: Home / Self Care | Payer: Medicaid Other | Attending: Obstetrics & Gynecology | Admitting: Obstetrics & Gynecology

## 2021-03-20 ENCOUNTER — Other Ambulatory Visit: Payer: Self-pay

## 2021-03-20 DIAGNOSIS — N898 Other specified noninflammatory disorders of vagina: Secondary | ICD-10-CM

## 2021-03-20 DIAGNOSIS — Z3480 Encounter for supervision of other normal pregnancy, unspecified trimester: Secondary | ICD-10-CM

## 2021-03-20 DIAGNOSIS — B951 Streptococcus, group B, as the cause of diseases classified elsewhere: Secondary | ICD-10-CM

## 2021-03-20 DIAGNOSIS — B9689 Other specified bacterial agents as the cause of diseases classified elsewhere: Secondary | ICD-10-CM | POA: Insufficient documentation

## 2021-03-20 DIAGNOSIS — O26893 Other specified pregnancy related conditions, third trimester: Secondary | ICD-10-CM

## 2021-03-20 DIAGNOSIS — R102 Pelvic and perineal pain: Secondary | ICD-10-CM | POA: Insufficient documentation

## 2021-03-20 DIAGNOSIS — N760 Acute vaginitis: Secondary | ICD-10-CM

## 2021-03-20 DIAGNOSIS — Z3A30 30 weeks gestation of pregnancy: Secondary | ICD-10-CM | POA: Insufficient documentation

## 2021-03-20 DIAGNOSIS — O23593 Infection of other part of genital tract in pregnancy, third trimester: Secondary | ICD-10-CM | POA: Insufficient documentation

## 2021-03-21 ENCOUNTER — Encounter (HOSPITAL_COMMUNITY): Payer: Self-pay | Admitting: Obstetrics & Gynecology

## 2021-03-21 DIAGNOSIS — N760 Acute vaginitis: Secondary | ICD-10-CM

## 2021-03-21 DIAGNOSIS — O99891 Other specified diseases and conditions complicating pregnancy: Secondary | ICD-10-CM | POA: Diagnosis not present

## 2021-03-21 DIAGNOSIS — B9689 Other specified bacterial agents as the cause of diseases classified elsewhere: Secondary | ICD-10-CM

## 2021-03-21 DIAGNOSIS — Z3689 Encounter for other specified antenatal screening: Secondary | ICD-10-CM

## 2021-03-21 DIAGNOSIS — R102 Pelvic and perineal pain: Secondary | ICD-10-CM | POA: Diagnosis not present

## 2021-03-21 DIAGNOSIS — Z3A30 30 weeks gestation of pregnancy: Secondary | ICD-10-CM

## 2021-03-21 DIAGNOSIS — O23593 Infection of other part of genital tract in pregnancy, third trimester: Secondary | ICD-10-CM | POA: Diagnosis not present

## 2021-03-21 DIAGNOSIS — O26893 Other specified pregnancy related conditions, third trimester: Secondary | ICD-10-CM | POA: Diagnosis not present

## 2021-03-21 LAB — GC/CHLAMYDIA PROBE AMP (~~LOC~~) NOT AT ARMC
Chlamydia: NEGATIVE
Comment: NEGATIVE
Comment: NORMAL
Neisseria Gonorrhea: NEGATIVE

## 2021-03-21 LAB — URINALYSIS, ROUTINE W REFLEX MICROSCOPIC
Bilirubin Urine: NEGATIVE
Glucose, UA: NEGATIVE mg/dL
Hgb urine dipstick: NEGATIVE
Ketones, ur: NEGATIVE mg/dL
Leukocytes,Ua: NEGATIVE
Nitrite: NEGATIVE
Protein, ur: NEGATIVE mg/dL
Specific Gravity, Urine: 1.005 (ref 1.005–1.030)
pH: 7 (ref 5.0–8.0)

## 2021-03-21 LAB — WET PREP, GENITAL
Sperm: NONE SEEN
Trich, Wet Prep: NONE SEEN
Yeast Wet Prep HPF POC: NONE SEEN

## 2021-03-21 MED ORDER — METRONIDAZOLE 0.75 % VA GEL: 1.0000 | Freq: Every day | VAGINAL | 0 refills | Status: DC

## 2021-03-21 NOTE — MAU Note (Signed)
Pt reports she thinks her mucus plug came out about 11am today. Since then she has been having some braxton hicks ctx about 4-5 times today. Had one in the shower but now the pain is constant in her lower abd and is not going away.   Good fetal movement felt. Denis any vag bleeding or leaking at this time.

## 2021-03-21 NOTE — MAU Provider Note (Signed)
Event Date/Time   First Provider Initiated Contact with Patient 03/21/21 0100     S Ms. Christina Woodard is a 30 y.o. [phone] pregnant female at [redacted]w[redacted]d who presents to MAU today with complaint of increased pelvic pressure, some BH contractions earlier today (4-5 total) and thinks she lost her mucus plug in the shower earlier when a large clump of yellow/green mucus came out.  Denies vaginal bleeding, loss of fluid, decreased fetal movement or cramping. No other physical complaints.  Receives care at [facility]. Prenatal records reviewed. History of two CS. Has missed several appointments and has an U/S (anatomy scan) scheduled for tomorrow. Told RN "I know my appointment tomorrow is just an ultrasound and I thought I should get checked out since I haven't been to my appointments in awhile."  Pertinent items noted in HPI and remainder of comprehensive ROS otherwise negative.  O BP 113/72 (BP Location: Right Arm)   Pulse 98   Temp 97.7 F (36.5 C) (Oral)   Resp 18   LMP 08/10/2020   SpO2 95%  Physical Exam Vitals and nursing note reviewed.  Constitutional:      General: She is not in acute distress.    Appearance: She is well-developed. She is not ill-appearing.  HENT:     Head: Normocephalic.  Eyes:     Pupils: Pupils are equal, round, and reactive to light.  Cardiovascular:     Rate and Rhythm: Normal rate.  Pulmonary:     Effort: Pulmonary effort is normal.  Abdominal:     General: There is no distension.     Palpations: Abdomen is soft.     Tenderness: There is no abdominal tenderness.  Genitourinary:    Comments: Appears to be vertex by Leopold's. Blind swabs obtained. Skin:    General: Skin is warm and dry.     Capillary Refill: Capillary refill takes less than 2 seconds.  Neurological:     Mental Status: She is alert and oriented to person, place, and time.  Psychiatric:        Mood and Affect: Mood normal.        Behavior: Behavior normal.        Thought Content:  Thought content normal.        Judgment: Judgment normal.   Fetal Tracing: reactive Baseline: 130 Variability: moderate Accelerations: 15x15 Decelerations: none Toco: UI to relaxed  Wet prep and GC/CT obtained, showed BV present. Declined cervical exam.  A Bacterial vaginosis NST reactive  P Discharge from MAU in stable condition with prescription for metrogel and return precautions Follow up at [facility], strongly encouraged to attend office visits as scheduled  Bernerd Limbo, CNM 03/21/2021 1:27 AM

## 2021-03-22 ENCOUNTER — Ambulatory Visit: Payer: Medicaid Other

## 2021-03-31 ENCOUNTER — Other Ambulatory Visit: Payer: Self-pay

## 2021-03-31 ENCOUNTER — Ambulatory Visit (INDEPENDENT_AMBULATORY_CARE_PROVIDER_SITE_OTHER): Payer: Medicaid Other | Admitting: Family Medicine

## 2021-03-31 VITALS — BP 128/81 | HR 109 | Wt 166.0 lb

## 2021-03-31 DIAGNOSIS — Z3480 Encounter for supervision of other normal pregnancy, unspecified trimester: Secondary | ICD-10-CM

## 2021-03-31 DIAGNOSIS — Z3A31 31 weeks gestation of pregnancy: Secondary | ICD-10-CM

## 2021-03-31 DIAGNOSIS — O2343 Unspecified infection of urinary tract in pregnancy, third trimester: Secondary | ICD-10-CM

## 2021-03-31 DIAGNOSIS — B951 Streptococcus, group B, as the cause of diseases classified elsewhere: Secondary | ICD-10-CM

## 2021-03-31 NOTE — Progress Notes (Signed)
Patient has no showed multiple appointments since July 2022. Patient sent to lab for one hr gtt today and 28 week labs. Armandina Stammer RN

## 2021-03-31 NOTE — Progress Notes (Signed)
   PRENATAL VISIT NOTE  Subjective:  Christina Woodard is a 30 y.o. [id number] at [redacted]w[redacted]d being seen today for ongoing prenatal care.  She is currently monitored for the following issues for this high-risk pregnancy and has History of cesarean delivery; History of syphilis; Status post cesarean section; Supervision of other normal pregnancy, antepartum; and GBS (group B streptococcus) UTI complicating pregnancy on their problem list.  She has missed several appointments due to illnesses with husband, who has been admitted to the hospital three times due to diabetes, leg infections, etc. Has a lot of stress.  Patient reports  hemorrhoids .  Contractions: Irritability. Vag. Bleeding: None.  Movement: Present. Denies leaking of fluid.   The following portions of the patient's history were reviewed and updated as appropriate: allergies, current medications, past family history, past medical history, past social history, past surgical history and problem list.   Objective:   Vitals:   03/31/21 1041  BP: 128/81  Pulse: (!) 109  Weight: 166 lb (75.3 kg)    Fetal Status: Fetal Heart Rate (bpm): 144   Movement: Present     General:  Alert, oriented and cooperative. Patient is in no acute distress.  Skin: Skin is warm and dry. No rash noted.   Cardiovascular: Normal heart rate noted  Respiratory: Normal respiratory effort, no problems with respiration noted  Abdomen: Soft, gravid, appropriate for gestational age.  Pain/Pressure: Present     Pelvic: Cervical exam deferred        Extremities: Normal range of motion.  Edema: None  Mental Status: Normal mood and affect. Normal behavior. Normal judgment and thought content.   Assessment and Plan:  Pregnancy: [id number] at [redacted]w[redacted]d 1. Supervision of other normal pregnancy, antepartum FHT and FH normal.  Wants IUD for contraception, but does not want family members to know about it because they are against birth control. Would like c/s on 11/14 or 15.  -  Glucose tolerance, 1 hour - CBC - RPR - HIV antibody (with reflex)  2. Group B Streptococcus urinary tract infection affecting pregnancy in third trimester  3. [redacted] weeks gestation of pregnancy   Preterm labor symptoms and general obstetric precautions including but not limited to vaginal bleeding, contractions, leaking of fluid and fetal movement were reviewed in detail with the patient. Please refer to After Visit Summary for other counseling recommendations.   No follow-ups on file.  Future Appointments  Date Time Provider Department Center  04/11/2021  1:30 PM [facility] NURSE [facility]  04/11/2021  1:45 PM [facility] US5 [facility]    Levie Heritage, DO

## 2021-04-01 LAB — CBC
Hematocrit: 30.4 % — ABNORMAL LOW (ref 34.0–46.6)
Hemoglobin: 10.1 g/dL — ABNORMAL LOW (ref 11.1–15.9)
MCH: 28.8 pg (ref 26.6–33.0)
MCHC: 33.2 g/dL (ref 31.5–35.7)
MCV: 87 fL (ref 79–97)
Platelets: 259 10*3/uL (ref 150–450)
RBC: 3.51 x10E6/uL — ABNORMAL LOW (ref 3.77–5.28)
RDW: 13.7 % (ref 11.7–15.4)
WBC: 7.9 10*3/uL (ref 3.4–10.8)

## 2021-04-01 LAB — HIV ANTIBODY (ROUTINE TESTING W REFLEX): HIV Screen 4th Generation wRfx: NONREACTIVE

## 2021-04-01 LAB — RPR: RPR Ser Ql: NONREACTIVE

## 2021-04-01 LAB — GLUCOSE TOLERANCE, 1 HOUR: Glucose, 1Hr PP: 111 mg/dL (ref 65–199)

## 2021-04-11 ENCOUNTER — Other Ambulatory Visit: Payer: Self-pay

## 2021-04-11 ENCOUNTER — Ambulatory Visit: Payer: Medicaid Other

## 2021-04-13 ENCOUNTER — Other Ambulatory Visit: Payer: Self-pay

## 2021-04-13 ENCOUNTER — Ambulatory Visit (INDEPENDENT_AMBULATORY_CARE_PROVIDER_SITE_OTHER): Payer: Medicaid Other | Admitting: Family Medicine

## 2021-04-13 ENCOUNTER — Telehealth: Payer: Self-pay | Admitting: *Deleted

## 2021-04-13 VITALS — BP 124/71 | HR 110 | Wt 167.0 lb

## 2021-04-13 DIAGNOSIS — Z3A33 33 weeks gestation of pregnancy: Secondary | ICD-10-CM

## 2021-04-13 DIAGNOSIS — O2343 Unspecified infection of urinary tract in pregnancy, third trimester: Secondary | ICD-10-CM

## 2021-04-13 DIAGNOSIS — B951 Streptococcus, group B, as the cause of diseases classified elsewhere: Secondary | ICD-10-CM

## 2021-04-13 DIAGNOSIS — Z3480 Encounter for supervision of other normal pregnancy, unspecified trimester: Secondary | ICD-10-CM

## 2021-04-13 DIAGNOSIS — Z98891 History of uterine scar from previous surgery: Secondary | ICD-10-CM

## 2021-04-13 NOTE — Progress Notes (Signed)
1. [redacted] weeks gestation of pregnancy  2. Supervision of other normal pregnancy, antepartum FHT and FH normal. 1hr GTT normal  3. History of cesarean delivery Will schedule rpt LTCS with IUD insertion.  4. Group B Streptococcus urinary tract infection affecting pregnancy in third trimester

## 2021-04-13 NOTE — Telephone Encounter (Signed)
Call to patient to advise of c-section date. No answer and no voice mail. Will send My Chart message.

## 2021-04-13 NOTE — Progress Notes (Signed)
Patient doing well. Patient states she is trying to get her GED and that is stressing her some. Armandina Stammer RN

## 2021-04-25 ENCOUNTER — Encounter: Payer: Medicaid Other | Admitting: Advanced Practice Midwife

## 2021-05-03 ENCOUNTER — Ambulatory Visit (HOSPITAL_BASED_OUTPATIENT_CLINIC_OR_DEPARTMENT_OTHER): Payer: Medicaid Other

## 2021-05-03 ENCOUNTER — Ambulatory Visit: Payer: Medicaid Other | Attending: Obstetrics and Gynecology | Admitting: *Deleted

## 2021-05-03 ENCOUNTER — Encounter: Payer: Self-pay | Admitting: *Deleted

## 2021-05-03 ENCOUNTER — Other Ambulatory Visit: Payer: Self-pay

## 2021-05-03 VITALS — BP 113/78 | HR 109

## 2021-05-03 DIAGNOSIS — O34219 Maternal care for unspecified type scar from previous cesarean delivery: Secondary | ICD-10-CM | POA: Diagnosis not present

## 2021-05-03 DIAGNOSIS — O2343 Unspecified infection of urinary tract in pregnancy, third trimester: Secondary | ICD-10-CM

## 2021-05-03 DIAGNOSIS — Z363 Encounter for antenatal screening for malformations: Secondary | ICD-10-CM | POA: Insufficient documentation

## 2021-05-03 DIAGNOSIS — Z3480 Encounter for supervision of other normal pregnancy, unspecified trimester: Secondary | ICD-10-CM

## 2021-05-03 DIAGNOSIS — O0933 Supervision of pregnancy with insufficient antenatal care, third trimester: Secondary | ICD-10-CM | POA: Diagnosis not present

## 2021-05-03 DIAGNOSIS — Z3A36 36 weeks gestation of pregnancy: Secondary | ICD-10-CM | POA: Diagnosis not present

## 2021-05-03 DIAGNOSIS — Z3483 Encounter for supervision of other normal pregnancy, third trimester: Secondary | ICD-10-CM | POA: Diagnosis present

## 2021-05-04 ENCOUNTER — Telehealth (INDEPENDENT_AMBULATORY_CARE_PROVIDER_SITE_OTHER): Payer: Medicaid Other | Admitting: Family Medicine

## 2021-05-04 DIAGNOSIS — O2343 Unspecified infection of urinary tract in pregnancy, third trimester: Secondary | ICD-10-CM | POA: Diagnosis not present

## 2021-05-04 DIAGNOSIS — O34219 Maternal care for unspecified type scar from previous cesarean delivery: Secondary | ICD-10-CM | POA: Diagnosis not present

## 2021-05-04 DIAGNOSIS — Z98891 History of uterine scar from previous surgery: Secondary | ICD-10-CM

## 2021-05-04 DIAGNOSIS — Z3480 Encounter for supervision of other normal pregnancy, unspecified trimester: Secondary | ICD-10-CM

## 2021-05-04 DIAGNOSIS — O99820 Streptococcus B carrier state complicating pregnancy: Secondary | ICD-10-CM

## 2021-05-04 DIAGNOSIS — B951 Streptococcus, group B, as the cause of diseases classified elsewhere: Secondary | ICD-10-CM

## 2021-05-04 DIAGNOSIS — Z3A36 36 weeks gestation of pregnancy: Secondary | ICD-10-CM

## 2021-05-04 NOTE — Progress Notes (Signed)
OBSTETRICS PRENATAL VIRTUAL VISIT ENCOUNTER NOTE  Provider location: [facility]   Patient location: Home  I connected with Christina Woodard on 05/04/21 at  1:30 PM EDT by MyChart Video Encounter and verified that I am speaking with the correct person using two identifiers. I discussed the limitations, risks, security and privacy concerns of performing an evaluation and management service virtually and the availability of in person appointments. I also discussed with the patient that there may be a patient responsible charge related to this service. The patient expressed understanding and agreed to proceed. Subjective:  Christina Woodard is a 30 y.o. [id number] at [redacted]w[redacted]d being seen today for ongoing prenatal care.  She is currently monitored for the following issues for this high-risk pregnancy and has History of cesarean delivery; History of syphilis; Status post cesarean section; Supervision of other normal pregnancy, antepartum; and GBS (group B streptococcus) UTI complicating pregnancy on their problem list.  Patient reports no complaints.  Contractions: Irritability. Vag. Bleeding: None.  Movement: Present. Denies any leaking of fluid.   The following portions of the patient's history were reviewed and updated as appropriate: allergies, current medications, past family history, past medical history, past social history, past surgical history and problem list.   Objective:  There were no vitals filed for this visit.  Fetal Status:     Movement: Present     General:  Alert, oriented and cooperative. Patient is in no acute distress.  Respiratory: Normal respiratory effort, no problems with respiration noted  Mental Status: Normal mood and affect. Normal behavior. Normal judgment and thought content.  Rest of physical exam deferred due to type of encounter  Imaging: [place] MFM OB DETAIL +14 WK  Result Date:  05/03/2021 ----------------------------------------------------------------------  OBSTETRICS REPORT                       (Signed Final 05/03/2021 03:15 pm) ---------------------------------------------------------------------- Patient Info  ID #:       [id number]                          D.O.B.:  08-31-1990 (30 yrs)  Name:       Christina Woodard               Visit Date: 05/03/2021 01:55 pm ---------------------------------------------------------------------- Performed By  Attending:        Ma Rings MD         Ref. Address:     [address] [place],                                                             [place] [address]  Performed By:     Clayton Lefort RDMS       [facility]  Referred By:      Duane Lope NP ---------------------------------------------------------------------- Orders  #  Description                           Code        Ordered By  1  [place] MFM OB DETAIL +14 WK               76811.01    [facility] ----------------------------------------------------------------------  #  Order #                     Accession #                Episode #  1  [id number]                   [id number]                 [id number] ---------------------------------------------------------------------- Indications  Insufficient Prenatal Care                     O09.30  [redacted] weeks gestation of pregnancy                Z3A.36  Encounter for antenatal screening for          Z36.3  malformations  LR NIPS, Neg Horizon  Previous cesarean delivery, antepartum         O34.219 ---------------------------------------------------------------------- Fetal Evaluation  Num Of Fetuses:         1  Fetal Heart  Rate(bpm):  131  Cardiac Activity:       Observed  Presentation:           Cephalic  Placenta:               Anterior  P. Cord Insertion:      Visualized, central  Amniotic Fluid  AFI FV:      Within normal limits  AFI Sum(cm)     %Tile       Largest Pocket(cm)  11.46           34          4.2  RUQ(cm)       RLQ(cm)       LUQ(cm)        LLQ(cm)  1.93          2.43          4.2            2.9 ---------------------------------------------------------------------- Biometry  BPD:      87.8  mm  G. Age:  35w 3d         31  %    CI:        77.55   %    70 - 86                                                          FL/HC:      21.9   %    20.8 - 22.6  HC:      315.6  mm     G. Age:  35w 3d          6  %    HC/AC:      0.97        0.92 - 1.05  AC:      324.9  mm     G. Age:  36w 3d         58  %    FL/BPD:     78.6   %    71 - 87  FL:         69  mm     G. Age:  35w 3d         19  %    FL/AC:      21.2   %    20 - 24  HUM:        60  mm     G. Age:  34w 6d         34  %  CER:      48.3  mm     G. Age:  36w 2d         38  %  LV:        3.4  mm  CM:        9.5  mm  Est. FW:    2808  gm      6 lb 3 oz     36  % ---------------------------------------------------------------------- OB History  Gravidity:    4         Term:   2         SAB:   1  Living:       2 ---------------------------------------------------------------------- Gestational Age  LMP:           38w 0d        Date:  08/10/20                 EDD:   05/17/21  U/S Today:     35w 5d                                        EDD:   06/02/21  Best:          36w 4d     Det. ByMarcella Dubs         EDD:   05/27/21                                      (10/11/20) ---------------------------------------------------------------------- Anatomy  Cranium:  Appears normal         Aortic Arch:            Appears normal  Cavum:                 Appears normal         Ductal Arch:            Appears normal  Ventricles:            Appears normal          Diaphragm:              Appears normal  Choroid Plexus:        Appears normal         Stomach:                Appears normal, left                                                                        sided  Cerebellum:            Appears normal         Abdomen:                Appears normal  Posterior Fossa:       Appears normal         Abdominal Wall:         Appears nml (cord                                                                        insert, abd wall)  Nuchal Fold:           Not applicable (>20    Cord Vessels:           Appears normal ([redacted]                         wks GA)                                        vessel cord)  Face:                  Not well visualized    Kidneys:                Appear normal  Lips:                  Not well visualized    Bladder:                Appears normal  Thoracic:              Appears normal         Spine:                  Not well visualized  Heart:                 Appears normal         Upper Extremities:      Not well visualized                         (4CH, axis, and                         situs)  RVOT:                  Not well visualized    Lower Extremities:      Not well visualized  LVOT:                  Appears normal  Other:  Technically difficult scan due to advanced gestational age. ---------------------------------------------------------------------- Targeted Anatomy  Thorax  SVC:                   Appears normal         3 V Trachea View:       Appears normal  3 Vessel View:         Appears normal         IVC:                    Appears normal  Other  Genitalia:             Normal Female ---------------------------------------------------------------------- Cervix Uterus Adnexa  Cervix  Not visualized (advanced GA >24wks)  Right Ovary  Not visualized.  Left Ovary  Not visualized. ---------------------------------------------------------------------- Comments  This patient was seen for a detailed fetal anatomy scan as  she has had limited prenatal care  in her current pregnancy.  The patient has missed her prior ultrasound appointments in  our office.  She denies any what significant past medical history and  denies any problems in her current pregnancy.  She had a cell free DNA test earlier in her pregnancy which  indicated a low risk for trisomy 60, 61, and 13. A female fetus is  predicted.  She was informed that the fetal growth and amniotic fluid  level were appropriate for her gestational age.  The views of the fetal anatomy were limited today due to her  advanced gestational age.  She was advised to have her  baby examined after birth to ensure that there are no  abnormalities present.  The patient was informed that anomalies may be missed due  to technical limitations. If the fetus is in a suboptimal position  or maternal habitus is increased, visualization of the fetus in  the maternal uterus may be impaired.  The patient reports that she already has a repeat cesarean  delivery scheduled on May 23, 2021.  No further exams were scheduled in our office. ----------------------------------------------------------------------                   Ma Rings, MD Electronically Signed Final Report   05/03/2021 03:15 pm ----------------------------------------------------------------------   Assessment and Plan:  Pregnancy: [id number] at [redacted]w[redacted]d 1. Supervision of other normal pregnancy, antepartum Good fetal movement  2. Group B Streptococcus urinary tract infection affecting pregnancy in third trimester  3. Status post cesarean section Scheduled for rpt c/s at 39 weeks.  Preterm labor symptoms and general obstetric precautions including but not limited to vaginal bleeding, contractions, leaking of fluid and fetal  movement were reviewed in detail with the patient. I discussed the assessment and treatment plan with the patient. The patient was provided an opportunity to ask questions and all were answered. The patient agreed with the plan and demonstrated  an understanding of the instructions. The patient was advised to call back or seek an in-person office evaluation/go to MAU at [facility] for any urgent or concerning symptoms. Please refer to After Visit Summary for other counseling recommendations.   I provided 5 minutes of face-to-face time during this encounter.  No follow-ups on file.  Future Appointments  Date Time Provider Department Center  05/04/2021  1:30 PM Levie Heritage, DO [facility] None  05/10/2021  9:55 AM Levie Heritage, DO [facility] None  05/17/2021  9:55 AM Levie Heritage, DO [facility] None  05/24/2021  9:55 AM Adrian Blackwater, Rhona Raider, DO [facility] None    Levie Heritage, DO [facility] [employer], [facility]

## 2021-05-04 NOTE — Progress Notes (Signed)
Patient agreed to virtual visit today due to unable to make it to clinic. Armandina Stammer RN

## 2021-05-10 ENCOUNTER — Encounter: Payer: Medicaid Other | Admitting: Family Medicine

## 2021-05-10 NOTE — Patient Instructions (Signed)
Christina Woodard  05/10/2021   Your procedure is scheduled on:  05/23/2021  Arrive at 1030 at Entrance C on [employer] at [facility]  and [employer]. You are invited to use the FREE valet parking or use the Visitor's parking deck.  Pick up the phone at the desk and dial [phone].  Call this number if you have problems the morning of surgery: [phone]  Remember:   Do not eat food:(After Midnight) Desps de medianoche.  Do not drink clear liquids: (After Midnight) Desps de medianoche.  Take these medicines the morning of surgery with A SIP OF WATER:  none   Do not wear jewelry, make-up or nail polish.  Do not wear lotions, powders, or perfumes. Do not wear deodorant.  Do not shave 48 hours prior to surgery.  Do not bring valuables to the hospital.  [facility] is not   responsible for any belongings or valuables brought to the hospital.  Contacts, dentures or bridgework may not be worn into surgery.  Leave suitcase in the car. After surgery it may be brought to your room.  For patients admitted to the hospital, checkout time is 11:00 AM the day of              discharge.      Please read over the following fact sheets that you were given:     Preparing for Surgery

## 2021-05-11 ENCOUNTER — Telehealth (HOSPITAL_COMMUNITY): Payer: Self-pay | Admitting: *Deleted

## 2021-05-11 ENCOUNTER — Other Ambulatory Visit: Payer: Self-pay | Admitting: Family Medicine

## 2021-05-11 DIAGNOSIS — Z3480 Encounter for supervision of other normal pregnancy, unspecified trimester: Secondary | ICD-10-CM

## 2021-05-11 NOTE — Telephone Encounter (Signed)
Preadmission screen  

## 2021-05-12 ENCOUNTER — Encounter (HOSPITAL_COMMUNITY): Payer: Self-pay

## 2021-05-16 ENCOUNTER — Inpatient Hospital Stay (HOSPITAL_COMMUNITY)
Admission: AD | Admit: 2021-05-16 | Discharge: 2021-05-17 | Disposition: A | Discharge status: Home / Self Care | Payer: Medicaid Other | Attending: Obstetrics and Gynecology | Admitting: Obstetrics and Gynecology

## 2021-05-16 ENCOUNTER — Other Ambulatory Visit: Payer: Self-pay

## 2021-05-16 DIAGNOSIS — R519 Headache, unspecified: Secondary | ICD-10-CM | POA: Insufficient documentation

## 2021-05-16 DIAGNOSIS — O26893 Other specified pregnancy related conditions, third trimester: Secondary | ICD-10-CM | POA: Insufficient documentation

## 2021-05-16 DIAGNOSIS — Z3480 Encounter for supervision of other normal pregnancy, unspecified trimester: Secondary | ICD-10-CM

## 2021-05-16 DIAGNOSIS — O471 False labor at or after 37 completed weeks of gestation: Secondary | ICD-10-CM | POA: Insufficient documentation

## 2021-05-16 DIAGNOSIS — Z3A38 38 weeks gestation of pregnancy: Secondary | ICD-10-CM | POA: Insufficient documentation

## 2021-05-16 DIAGNOSIS — R1030 Lower abdominal pain, unspecified: Secondary | ICD-10-CM | POA: Insufficient documentation

## 2021-05-16 DIAGNOSIS — O26899 Other specified pregnancy related conditions, unspecified trimester: Secondary | ICD-10-CM

## 2021-05-16 DIAGNOSIS — R102 Pelvic and perineal pain: Secondary | ICD-10-CM | POA: Insufficient documentation

## 2021-05-16 DIAGNOSIS — R109 Unspecified abdominal pain: Secondary | ICD-10-CM

## 2021-05-16 NOTE — MAU Note (Signed)
Have had a very busy day. About 2030 I started having abdominal pain like a contraction but it does not ease up like ctxs do. When I wiped once tonight I saw blood but may have been my hemorrhoids. Just have "stiffness" in my abdomen.

## 2021-05-17 ENCOUNTER — Encounter (HOSPITAL_COMMUNITY): Payer: Self-pay | Admitting: Obstetrics and Gynecology

## 2021-05-17 ENCOUNTER — Encounter: Payer: Medicaid Other | Admitting: Family Medicine

## 2021-05-17 ENCOUNTER — Inpatient Hospital Stay (HOSPITAL_BASED_OUTPATIENT_CLINIC_OR_DEPARTMENT_OTHER): Payer: Medicaid Other

## 2021-05-17 DIAGNOSIS — Z3A38 38 weeks gestation of pregnancy: Secondary | ICD-10-CM

## 2021-05-17 DIAGNOSIS — O26893 Other specified pregnancy related conditions, third trimester: Secondary | ICD-10-CM | POA: Diagnosis not present

## 2021-05-17 DIAGNOSIS — O471 False labor at or after 37 completed weeks of gestation: Secondary | ICD-10-CM | POA: Diagnosis not present

## 2021-05-17 DIAGNOSIS — R109 Unspecified abdominal pain: Secondary | ICD-10-CM

## 2021-05-17 DIAGNOSIS — R519 Headache, unspecified: Secondary | ICD-10-CM | POA: Diagnosis not present

## 2021-05-17 DIAGNOSIS — R1030 Lower abdominal pain, unspecified: Secondary | ICD-10-CM | POA: Diagnosis not present

## 2021-05-17 DIAGNOSIS — R102 Pelvic and perineal pain: Secondary | ICD-10-CM | POA: Diagnosis not present

## 2021-05-17 LAB — URINALYSIS, ROUTINE W REFLEX MICROSCOPIC
Bilirubin Urine: NEGATIVE
Glucose, UA: NEGATIVE mg/dL
Hgb urine dipstick: NEGATIVE
Ketones, ur: NEGATIVE mg/dL
Leukocytes,Ua: NEGATIVE
Nitrite: NEGATIVE
Protein, ur: NEGATIVE mg/dL
Specific Gravity, Urine: 1.011 (ref 1.005–1.030)
pH: 7 (ref 5.0–8.0)

## 2021-05-17 NOTE — MAU Provider Note (Signed)
History     CSN: [id number]  Arrival date and time: 05/16/21 2336   Event Date/Time   First Provider Initiated Contact with Patient 05/17/21 0045      Chief Complaint  Patient presents with   Contractions   HPI Ms. OMNI DUNSWORTH is a 30 y.o. year old G75P2012 female at [redacted]w[redacted]d weeks gestation who presents to MAU reporting a "severe cramp" in the lower part of her abdomen after getting out of the shower at 2000 last evening. She describes the pain as "like a contraction." She states "my belly got really stiff and the pain is not letting up. It's not coming and going like I remember contractions." She reports she felt like she had to have a BM and couldn't when she first tried, but was eventually able to go. She has a prior h/o constipation. She does admit to having a "long and busy day today and not having rest or much water to drink." She reported a bad H/A. She is waiting on her mother who is flying into [place] airport from [place]. She has been stressed, because her mom's flight was delayed for "hours" in [place]. [place], [place]. Her mom is scheduled to get here until 0300 and they're going to have to pay almost $100 to get her a Lyft or [employer] from the airport to their house in [place]. Her spouse is present and contributing to the history taking.   OB History     Gravida  4   Para  2   Term  2   Preterm      AB  1   Living  2      SAB  1   IAB      Ectopic      Multiple  0   Live Births  2           Past Medical History:  Diagnosis Date   Anemia    Medical history non-contributory    Syphilis     Past Surgical History:  Procedure Laterality Date   CESAREAN SECTION     CESAREAN SECTION     CESAREAN SECTION N/A 01/10/2020   Procedure: CESAREAN SECTION;  Surgeon: Levie Heritage, DO;  Location: [facility]S;  Service: Obstetrics;  Laterality: N/A;    Family History  Problem Relation Age of Onset   Hypertension Mother    Healthy Father    Hypertension  Maternal Grandmother     Social History   Tobacco Use   Smoking status: Every Day    Packs/day: 1.00    Types: Cigarettes   Smokeless tobacco: Never  Substance Use Topics   Alcohol use: Not Currently    Comment: occ   Drug use: Not Currently    Types: Marijuana    Comment: LAST SMOKED 09-2020    Allergies: No Known Allergies  No medications prior to admission.    Review of Systems  Constitutional: Negative.   HENT: Negative.    Eyes: Negative.   Respiratory: Negative.    Cardiovascular: Negative.   Gastrointestinal:  Positive for abdominal pain.  Endocrine: Negative.   Genitourinary:  Positive for pelvic pain (severe cramp since 2000 tonight, that will not go away). Negative for vaginal bleeding.  Musculoskeletal: Negative.   Skin: Negative.   Allergic/Immunologic: Negative.   Neurological: Negative.   Hematological: Negative.   Psychiatric/Behavioral: Negative.    Physical Exam   Patient Vitals for the past 24 hrs:  BP Temp Pulse Resp SpO2  Height Weight  05/17/21 0152 117/77 -- -- -- -- -- --  05/17/21 0142 117/77 -- 90 -- -- -- --  05/17/21 0047 118/76 -- 90 -- -- -- --  05/16/21 2352 -- -- (!) 108 -- 99 % -- --  05/16/21 2351 -- (!) 97.3 F (36.3 C) -- 18 -- 5\' 4"  (1.626 m) 79.8 kg   Physical Exam Vitals and nursing note reviewed. Exam conducted with a chaperone present.  Constitutional:      Appearance: Normal appearance. She is normal weight.  Pulmonary:     Effort: Pulmonary effort is normal.  Abdominal:     Palpations: Abdomen is soft.     Comments: Abdomen was firm with initial exam, but softened up some just before going to U/S and was baseline softness once returned from U/S  Genitourinary:    General: Normal vulva.     Comments: Dilation: 1 Effacement (%): 70 Cervical Position: Posterior Station: -3 Presentation: Vertex Exam by: , CNM  Musculoskeletal:        General: Normal range of motion.  Skin:    General: Skin is warm and  dry.  Neurological:     Mental Status: She is alert and oriented to person, place, and time.  Psychiatric:        Mood and Affect: Mood normal.        Behavior: Behavior normal.        Thought Content: Thought content normal.        Judgment: Judgment normal.   REACTIVE NST - FHR: 135 bpm / moderate variability / accels present / decels absent / TOCO: irregular UCs with UI noted -- patient states "I didn't know I was actually having contractions." MAU Course  Procedures  MDM EFM OB Limited MFM U/S - patient felt better immediately after U/S Cervical Exam    Assessment and Plan  False labor after 37 weeks of gestation without delivery  - Information provided on false labor   Abdominal pain complicating pregnancy  - Reviewed preliminary results of U/S - Information provided on signs of abruption   [redacted] weeks gestation of pregnancy   - Discharge patient - Keep scheduled appt later this morning @ [facility] - Patient verbalized an understanding of the plan of care and agrees.   Carloyn Jaeger, CNM 05/17/2021, 12:54 AM

## 2021-05-17 NOTE — Discharge Instructions (Signed)
Signs of abruption: Mild to moderate contractions back to back or feel as though they won't go away -- belly will be "rock hard" Lots of vaginal bleeding, but also can be no vaginal bleeding Sometimes, back pain Decreased or no fetal movement

## 2021-05-22 ENCOUNTER — Encounter (HOSPITAL_COMMUNITY): Payer: Self-pay | Admitting: Family Medicine

## 2021-05-22 ENCOUNTER — Other Ambulatory Visit: Payer: Self-pay

## 2021-05-22 ENCOUNTER — Inpatient Hospital Stay (HOSPITAL_COMMUNITY): Payer: Medicaid Other | Admitting: Anesthesiology

## 2021-05-22 ENCOUNTER — Encounter (HOSPITAL_COMMUNITY)
Admission: AD | Disposition: A | Discharge status: Home / Self Care | Payer: Self-pay | Source: Home / Self Care | Attending: Obstetrics & Gynecology

## 2021-05-22 ENCOUNTER — Encounter (HOSPITAL_COMMUNITY)
Admission: RE | Admit: 2021-05-22 | Discharge: 2021-05-22 | Disposition: A | Discharge status: Home / Self Care | Payer: Medicaid Other | Source: Ambulatory Visit | Attending: Family Medicine | Admitting: Family Medicine

## 2021-05-22 ENCOUNTER — Inpatient Hospital Stay (HOSPITAL_COMMUNITY)
Admission: AD | Admit: 2021-05-22 | Discharge: 2021-05-24 | DRG: 788 | Disposition: A | Discharge status: Home / Self Care | Payer: Medicaid Other | Attending: Obstetrics & Gynecology | Admitting: Obstetrics & Gynecology

## 2021-05-22 DIAGNOSIS — Z3A39 39 weeks gestation of pregnancy: Secondary | ICD-10-CM

## 2021-05-22 DIAGNOSIS — Z30430 Encounter for insertion of intrauterine contraceptive device: Secondary | ICD-10-CM | POA: Diagnosis not present

## 2021-05-22 DIAGNOSIS — Z23 Encounter for immunization: Secondary | ICD-10-CM

## 2021-05-22 DIAGNOSIS — O0993 Supervision of high risk pregnancy, unspecified, third trimester: Secondary | ICD-10-CM

## 2021-05-22 DIAGNOSIS — Z01812 Encounter for preprocedural laboratory examination: Secondary | ICD-10-CM | POA: Insufficient documentation

## 2021-05-22 DIAGNOSIS — O99334 Smoking (tobacco) complicating childbirth: Secondary | ICD-10-CM | POA: Diagnosis present

## 2021-05-22 DIAGNOSIS — F17210 Nicotine dependence, cigarettes, uncomplicated: Secondary | ICD-10-CM | POA: Diagnosis present

## 2021-05-22 DIAGNOSIS — O4202 Full-term premature rupture of membranes, onset of labor within 24 hours of rupture: Secondary | ICD-10-CM | POA: Diagnosis not present

## 2021-05-22 DIAGNOSIS — O2340 Unspecified infection of urinary tract in pregnancy, unspecified trimester: Secondary | ICD-10-CM | POA: Diagnosis present

## 2021-05-22 DIAGNOSIS — Z30014 Encounter for initial prescription of intrauterine contraceptive device: Secondary | ICD-10-CM | POA: Diagnosis not present

## 2021-05-22 DIAGNOSIS — Z20822 Contact with and (suspected) exposure to covid-19: Secondary | ICD-10-CM | POA: Diagnosis present

## 2021-05-22 DIAGNOSIS — O2343 Unspecified infection of urinary tract in pregnancy, third trimester: Secondary | ICD-10-CM

## 2021-05-22 DIAGNOSIS — O26893 Other specified pregnancy related conditions, third trimester: Secondary | ICD-10-CM | POA: Diagnosis present

## 2021-05-22 DIAGNOSIS — Z3480 Encounter for supervision of other normal pregnancy, unspecified trimester: Secondary | ICD-10-CM

## 2021-05-22 DIAGNOSIS — O34211 Maternal care for low transverse scar from previous cesarean delivery: Secondary | ICD-10-CM | POA: Diagnosis not present

## 2021-05-22 DIAGNOSIS — Z98891 History of uterine scar from previous surgery: Secondary | ICD-10-CM

## 2021-05-22 DIAGNOSIS — O34219 Maternal care for unspecified type scar from previous cesarean delivery: Principal | ICD-10-CM | POA: Diagnosis present

## 2021-05-22 DIAGNOSIS — B951 Streptococcus, group B, as the cause of diseases classified elsewhere: Secondary | ICD-10-CM | POA: Diagnosis present

## 2021-05-22 HISTORY — DX: Anemia, unspecified: D64.9

## 2021-05-22 LAB — CBC
HCT: 33.5 % — ABNORMAL LOW (ref 36.0–46.0)
Hemoglobin: 10.9 g/dL — ABNORMAL LOW (ref 12.0–15.0)
MCH: 27.8 pg (ref 26.0–34.0)
MCHC: 32.5 g/dL (ref 30.0–36.0)
MCV: 85.5 fL (ref 80.0–100.0)
Platelets: 283 10*3/uL (ref 150–400)
RBC: 3.92 MIL/uL (ref 3.87–5.11)
RDW: 14.1 % (ref 11.5–15.5)
WBC: 8.5 10*3/uL (ref 4.0–10.5)
nRBC: 0 % (ref 0.0–0.2)

## 2021-05-22 LAB — RESP PANEL BY RT-PCR (FLU A&B, COVID) ARPGX2
Influenza A by PCR: NEGATIVE
Influenza B by PCR: NEGATIVE
SARS Coronavirus 2 by RT PCR: NEGATIVE

## 2021-05-22 LAB — RPR: RPR Ser Ql: NONREACTIVE

## 2021-05-22 LAB — RAPID HIV SCREEN (HIV 1/2 AB+AG)
HIV 1/2 Antibodies: NONREACTIVE
HIV-1 P24 Antigen - HIV24: NONREACTIVE

## 2021-05-22 LAB — TYPE AND SCREEN
ABO/RH(D): O POS
Antibody Screen: NEGATIVE

## 2021-05-22 SURGERY — Surgical Case
Anesthesia: Spinal

## 2021-05-22 MED ORDER — DIBUCAINE (PERIANAL) 1 % EX OINT: 1.0000 "application " | TOPICAL_OINTMENT | CUTANEOUS | Status: DC | PRN

## 2021-05-22 MED ORDER — NALOXONE HCL 4 MG/10ML IJ SOLN
1.0000 ug/kg/h | INTRAVENOUS | Status: DC | PRN
  Filled 2021-05-22: qty 5

## 2021-05-22 MED ORDER — KETOROLAC TROMETHAMINE 30 MG/ML IJ SOLN
INTRAMUSCULAR | Status: AC
  Filled 2021-05-22: qty 1

## 2021-05-22 MED ORDER — HYDROMORPHONE HCL 1 MG/ML IJ SOLN
0.2500 mg | INTRAMUSCULAR | Status: DC | PRN
  Administered 2021-05-22: 0.25 mg via INTRAVENOUS

## 2021-05-22 MED ORDER — SODIUM CHLORIDE 0.9 % IV SOLN
500.0000 mg | Freq: Once | INTRAVENOUS | Status: DC
Start: 2021-05-22 — End: 2021-05-22

## 2021-05-22 MED ORDER — ONDANSETRON HCL 4 MG/2ML IJ SOLN
INTRAMUSCULAR | Status: AC
  Filled 2021-05-22: qty 2

## 2021-05-22 MED ORDER — SIMETHICONE 80 MG PO CHEW: 80.0000 mg | CHEWABLE_TABLET | ORAL | Status: DC | PRN

## 2021-05-22 MED ORDER — OXYTOCIN-SODIUM CHLORIDE 30-0.9 UT/500ML-% IV SOLN
INTRAVENOUS | Status: DC | PRN
  Administered 2021-05-22: 30 [IU] via INTRAVENOUS

## 2021-05-22 MED ORDER — SODIUM CHLORIDE 0.9% FLUSH: 3.0000 mL | INTRAVENOUS | Status: DC | PRN

## 2021-05-22 MED ORDER — CEFAZOLIN SODIUM-DEXTROSE 2-4 GM/100ML-% IV SOLN
INTRAVENOUS | Status: AC
  Filled 2021-05-22: qty 100

## 2021-05-22 MED ORDER — ZOLPIDEM TARTRATE 5 MG PO TABS: 5.0000 mg | ORAL_TABLET | Freq: Every evening | ORAL | Status: DC | PRN

## 2021-05-22 MED ORDER — PHENYLEPHRINE HCL-NACL 20-0.9 MG/250ML-% IV SOLN
INTRAVENOUS | Status: AC
  Filled 2021-05-22: qty 250

## 2021-05-22 MED ORDER — ACETAMINOPHEN 10 MG/ML IV SOLN
INTRAVENOUS | Status: AC
  Filled 2021-05-22: qty 100

## 2021-05-22 MED ORDER — MEPERIDINE HCL 25 MG/ML IJ SOLN: 6.2500 mg | INTRAMUSCULAR | Status: DC | PRN

## 2021-05-22 MED ORDER — FENTANYL CITRATE (PF) 100 MCG/2ML IJ SOLN
INTRAMUSCULAR | Status: AC
  Filled 2021-05-22: qty 2

## 2021-05-22 MED ORDER — OXYCODONE HCL 5 MG PO TABS
5.0000 mg | ORAL_TABLET | ORAL | Status: DC | PRN
  Administered 2021-05-23 (×2): 10 mg via ORAL
  Administered 2021-05-23 – 2021-05-24 (×2): 5 mg via ORAL
  Filled 2021-05-22 (×2): qty 1
  Filled 2021-05-22 (×2): qty 2

## 2021-05-22 MED ORDER — KETOROLAC TROMETHAMINE 30 MG/ML IJ SOLN
30.0000 mg | Freq: Four times a day (QID) | INTRAMUSCULAR | Status: AC | PRN
  Administered 2021-05-22: 30 mg via INTRAVENOUS

## 2021-05-22 MED ORDER — MORPHINE SULFATE (PF) 0.5 MG/ML IJ SOLN
INTRAMUSCULAR | Status: DC | PRN
  Administered 2021-05-22: 150 ug via INTRATHECAL

## 2021-05-22 MED ORDER — SODIUM CHLORIDE 0.9 % IR SOLN
Status: DC | PRN
  Administered 2021-05-22: 1

## 2021-05-22 MED ORDER — FENTANYL CITRATE (PF) 100 MCG/2ML IJ SOLN
INTRAMUSCULAR | Status: DC | PRN
  Administered 2021-05-22: 15 ug via INTRATHECAL

## 2021-05-22 MED ORDER — DEXMEDETOMIDINE (PRECEDEX) IN NS 20 MCG/5ML (4 MCG/ML) IV SYRINGE
PREFILLED_SYRINGE | INTRAVENOUS | Status: DC | PRN
  Administered 2021-05-22 (×3): 4 ug via INTRAVENOUS

## 2021-05-22 MED ORDER — COCONUT OIL OIL: 1.0000 "application " | TOPICAL_OIL | Status: DC | PRN

## 2021-05-22 MED ORDER — SCOPOLAMINE 1 MG/3DAYS TD PT72
MEDICATED_PATCH | TRANSDERMAL | Status: AC
  Filled 2021-05-22: qty 1

## 2021-05-22 MED ORDER — LEVONORGESTREL 20.1 MCG/DAY IU IUD
1.0000 | INTRAUTERINE_SYSTEM | INTRAUTERINE | Status: AC
  Administered 2021-05-22: 1 via INTRAUTERINE
  Filled 2021-05-22: qty 1

## 2021-05-22 MED ORDER — DEXAMETHASONE SODIUM PHOSPHATE 10 MG/ML IJ SOLN
INTRAMUSCULAR | Status: DC | PRN
  Administered 2021-05-22: 10 mg via INTRAVENOUS

## 2021-05-22 MED ORDER — ONDANSETRON HCL 4 MG/2ML IJ SOLN: 4.0000 mg | Freq: Three times a day (TID) | INTRAMUSCULAR | Status: DC | PRN

## 2021-05-22 MED ORDER — ONDANSETRON HCL 4 MG/2ML IJ SOLN
INTRAMUSCULAR | Status: DC | PRN
  Administered 2021-05-22: 4 mg via INTRAVENOUS

## 2021-05-22 MED ORDER — LEVONORGESTREL 20.1 MCG/DAY IU IUD
INTRAUTERINE_SYSTEM | INTRAUTERINE | Status: AC
  Filled 2021-05-22: qty 1

## 2021-05-22 MED ORDER — NALBUPHINE HCL 10 MG/ML IJ SOLN
5.0000 mg | INTRAMUSCULAR | Status: DC | PRN
  Administered 2021-05-24: 5 mg via SUBCUTANEOUS

## 2021-05-22 MED ORDER — SENNOSIDES-DOCUSATE SODIUM 8.6-50 MG PO TABS
2.0000 | ORAL_TABLET | Freq: Every day | ORAL | Status: DC
  Administered 2021-05-23: 2 via ORAL
  Filled 2021-05-22: qty 2

## 2021-05-22 MED ORDER — NALOXONE HCL 0.4 MG/ML IJ SOLN: 0.4000 mg | INTRAMUSCULAR | Status: DC | PRN

## 2021-05-22 MED ORDER — MORPHINE SULFATE (PF) 0.5 MG/ML IJ SOLN
INTRAMUSCULAR | Status: AC
  Filled 2021-05-22: qty 10

## 2021-05-22 MED ORDER — LACTATED RINGERS IV SOLN
125.0000 mL/h | INTRAVENOUS | Status: DC
  Administered 2021-05-22: 125 mL/h via INTRAVENOUS

## 2021-05-22 MED ORDER — DIPHENHYDRAMINE HCL 50 MG/ML IJ SOLN
12.5000 mg | INTRAMUSCULAR | Status: DC | PRN
  Administered 2021-05-22: 12.5 mg via INTRAVENOUS

## 2021-05-22 MED ORDER — SCOPOLAMINE 1 MG/3DAYS TD PT72
1.0000 | MEDICATED_PATCH | Freq: Once | TRANSDERMAL | Status: DC
  Administered 2021-05-22: 1.5 mg via TRANSDERMAL

## 2021-05-22 MED ORDER — MENTHOL 3 MG MT LOZG: 1.0000 | LOZENGE | OROMUCOSAL | Status: DC | PRN

## 2021-05-22 MED ORDER — OXYCODONE HCL 5 MG/5ML PO SOLN: 5.0000 mg | Freq: Once | ORAL | Status: DC | PRN

## 2021-05-22 MED ORDER — TETANUS-DIPHTH-ACELL PERTUSSIS 5-2.5-18.5 LF-MCG/0.5 IM SUSY: 0.5000 mL | PREFILLED_SYRINGE | Freq: Once | INTRAMUSCULAR | Status: DC

## 2021-05-22 MED ORDER — HYDROMORPHONE HCL 1 MG/ML IJ SOLN
INTRAMUSCULAR | Status: AC
  Filled 2021-05-22: qty 0.5

## 2021-05-22 MED ORDER — NALBUPHINE HCL 10 MG/ML IJ SOLN
5.0000 mg | INTRAMUSCULAR | Status: DC | PRN
  Administered 2021-05-22 – 2021-05-23 (×3): 5 mg via INTRAVENOUS
  Filled 2021-05-22 (×4): qty 1

## 2021-05-22 MED ORDER — OXYCODONE HCL 5 MG PO TABS: 5.0000 mg | ORAL_TABLET | Freq: Once | ORAL | Status: DC | PRN

## 2021-05-22 MED ORDER — ENOXAPARIN SODIUM 40 MG/0.4ML IJ SOSY
40.0000 mg | PREFILLED_SYRINGE | INTRAMUSCULAR | Status: DC
  Administered 2021-05-23 – 2021-05-24 (×2): 40 mg via SUBCUTANEOUS
  Filled 2021-05-22 (×2): qty 0.4

## 2021-05-22 MED ORDER — IBUPROFEN 600 MG PO TABS
600.0000 mg | ORAL_TABLET | Freq: Four times a day (QID) | ORAL | Status: AC
  Administered 2021-05-23 – 2021-05-24 (×5): 600 mg via ORAL
  Filled 2021-05-22 (×6): qty 1

## 2021-05-22 MED ORDER — OXYTOCIN-SODIUM CHLORIDE 30-0.9 UT/500ML-% IV SOLN
INTRAVENOUS | Status: AC
  Filled 2021-05-22: qty 500

## 2021-05-22 MED ORDER — STERILE WATER FOR IRRIGATION IR SOLN
Status: DC | PRN
  Administered 2021-05-22: 1

## 2021-05-22 MED ORDER — BUPIVACAINE IN DEXTROSE 0.75-8.25 % IT SOLN
INTRATHECAL | Status: DC | PRN
  Administered 2021-05-22: 1.6 mL via INTRATHECAL

## 2021-05-22 MED ORDER — DEXMEDETOMIDINE (PRECEDEX) IN NS 20 MCG/5ML (4 MCG/ML) IV SYRINGE
PREFILLED_SYRINGE | INTRAVENOUS | Status: AC
  Filled 2021-05-22: qty 5

## 2021-05-22 MED ORDER — POVIDONE-IODINE 10 % EX SWAB
2.0000 "application " | Freq: Once | CUTANEOUS | Status: AC
  Administered 2021-05-22: 2 via TOPICAL

## 2021-05-22 MED ORDER — KETOROLAC TROMETHAMINE 30 MG/ML IJ SOLN: 30.0000 mg | Freq: Four times a day (QID) | INTRAMUSCULAR | Status: AC | PRN

## 2021-05-22 MED ORDER — ACETAMINOPHEN 10 MG/ML IV SOLN
INTRAVENOUS | Status: DC | PRN
  Administered 2021-05-22: 1000 mg via INTRAVENOUS

## 2021-05-22 MED ORDER — WITCH HAZEL-GLYCERIN EX PADS: 1.0000 "application " | MEDICATED_PAD | CUTANEOUS | Status: DC | PRN

## 2021-05-22 MED ORDER — ACETAMINOPHEN 10 MG/ML IV SOLN: 1000.0000 mg | Freq: Once | INTRAVENOUS | Status: DC | PRN

## 2021-05-22 MED ORDER — NALBUPHINE HCL 10 MG/ML IJ SOLN: 5.0000 mg | Freq: Once | INTRAMUSCULAR | Status: AC | PRN

## 2021-05-22 MED ORDER — DIPHENHYDRAMINE HCL 25 MG PO CAPS
25.0000 mg | ORAL_CAPSULE | Freq: Four times a day (QID) | ORAL | Status: DC | PRN
  Administered 2021-05-22 – 2021-05-23 (×2): 25 mg via ORAL
  Filled 2021-05-22: qty 1

## 2021-05-22 MED ORDER — IBUPROFEN 600 MG PO TABS: 600.0000 mg | ORAL_TABLET | Freq: Four times a day (QID) | ORAL | Status: DC | PRN

## 2021-05-22 MED ORDER — DIPHENHYDRAMINE HCL 25 MG PO CAPS
25.0000 mg | ORAL_CAPSULE | ORAL | Status: DC | PRN
  Filled 2021-05-22: qty 1

## 2021-05-22 MED ORDER — DIPHENHYDRAMINE HCL 50 MG/ML IJ SOLN
INTRAMUSCULAR | Status: AC
  Filled 2021-05-22: qty 1

## 2021-05-22 MED ORDER — PROMETHAZINE HCL 25 MG/ML IJ SOLN: 6.2500 mg | INTRAMUSCULAR | Status: DC | PRN

## 2021-05-22 MED ORDER — NALBUPHINE HCL 10 MG/ML IJ SOLN
5.0000 mg | Freq: Once | INTRAMUSCULAR | Status: AC | PRN
  Administered 2021-05-22: 5 mg via INTRAVENOUS

## 2021-05-22 MED ORDER — PRENATAL MULTIVITAMIN CH
1.0000 | ORAL_TABLET | Freq: Every day | ORAL | Status: DC
  Administered 2021-05-23: 1 via ORAL
  Filled 2021-05-22: qty 1

## 2021-05-22 MED ORDER — OXYTOCIN-SODIUM CHLORIDE 30-0.9 UT/500ML-% IV SOLN: 2.5000 [IU]/h | INTRAVENOUS | Status: AC

## 2021-05-22 MED ORDER — NALBUPHINE HCL 10 MG/ML IJ SOLN
INTRAMUSCULAR | Status: AC
  Filled 2021-05-22: qty 1

## 2021-05-22 MED ORDER — SODIUM CHLORIDE 0.9 % IV SOLN
500.0000 mg | Freq: Once | INTRAVENOUS | Status: AC
  Administered 2021-05-22: 500 mg via INTRAVENOUS
  Filled 2021-05-22: qty 500

## 2021-05-22 MED ORDER — DEXAMETHASONE SODIUM PHOSPHATE 10 MG/ML IJ SOLN
INTRAMUSCULAR | Status: AC
  Filled 2021-05-22: qty 1

## 2021-05-22 MED ORDER — CEFAZOLIN SODIUM-DEXTROSE 2-4 GM/100ML-% IV SOLN
2.0000 g | INTRAVENOUS | Status: AC
  Administered 2021-05-22: 2 g via INTRAVENOUS

## 2021-05-22 MED ORDER — ACETAMINOPHEN 500 MG PO TABS
1000.0000 mg | ORAL_TABLET | Freq: Four times a day (QID) | ORAL | Status: DC
  Administered 2021-05-23 – 2021-05-24 (×5): 1000 mg via ORAL
  Filled 2021-05-22 (×5): qty 2

## 2021-05-22 MED ORDER — SIMETHICONE 80 MG PO CHEW
80.0000 mg | CHEWABLE_TABLET | Freq: Three times a day (TID) | ORAL | Status: DC
  Administered 2021-05-23 – 2021-05-24 (×4): 80 mg via ORAL
  Filled 2021-05-22 (×4): qty 1

## 2021-05-22 SURGICAL SUPPLY — 38 items
ADH SKN CLS APL DERMABOND .7 (GAUZE/BANDAGES/DRESSINGS) ×1
APL SKNCLS STERI-STRIP NONHPOA (GAUZE/BANDAGES/DRESSINGS) ×1
BENZOIN TINCTURE PRP APPL 2/3 (GAUZE/BANDAGES/DRESSINGS) ×3 IMPLANT
CHLORAPREP W/TINT 26ML (MISCELLANEOUS) ×3 IMPLANT
CLAMP CORD UMBIL (MISCELLANEOUS) IMPLANT
CLOSURE WOUND 1/2 X4 (GAUZE/BANDAGES/DRESSINGS) ×1
CLOTH BEACON ORANGE TIMEOUT ST (SAFETY) ×3 IMPLANT
DERMABOND ADVANCED (GAUZE/BANDAGES/DRESSINGS) ×2
DERMABOND ADVANCED .7 DNX12 (GAUZE/BANDAGES/DRESSINGS) IMPLANT
DRSG OPSITE POSTOP 4X10 (GAUZE/BANDAGES/DRESSINGS) ×3 IMPLANT
ELECT REM PT RETURN 9FT ADLT (ELECTROSURGICAL) ×3
ELECTRODE REM PT RTRN 9FT ADLT (ELECTROSURGICAL) ×1 IMPLANT
EXTRACTOR VACUUM M CUP 4 TUBE (SUCTIONS) IMPLANT
EXTRACTOR VACUUM M CUP 4' TUBE (SUCTIONS)
GLOVE BIOGEL PI IND STRL 7.0 (GLOVE) ×2 IMPLANT
GLOVE BIOGEL PI IND STRL 7.5 (GLOVE) ×2 IMPLANT
GLOVE BIOGEL PI INDICATOR 7.0 (GLOVE) ×4
GLOVE BIOGEL PI INDICATOR 7.5 (GLOVE) ×4
GLOVE ECLIPSE 7.5 STRL STRAW (GLOVE) ×3 IMPLANT
GOWN STRL REUS W/TWL LRG LVL3 (GOWN DISPOSABLE) ×9 IMPLANT
HEMOSTAT ARISTA ABSORB 3G PWDR (HEMOSTASIS) ×2 IMPLANT
KIT ABG SYR 3ML LUER SLIP (SYRINGE) IMPLANT
NDL HYPO 25X5/8 SAFETYGLIDE (NEEDLE) IMPLANT
NEEDLE HYPO 25X5/8 SAFETYGLIDE (NEEDLE) IMPLANT
NS IRRIG 1000ML POUR BTL (IV SOLUTION) ×3 IMPLANT
PACK C SECTION WH (CUSTOM PROCEDURE TRAY) ×3 IMPLANT
PAD OB MATERNITY 4.3X12.25 (PERSONAL CARE ITEMS) ×3 IMPLANT
PENCIL SMOKE EVAC W/HOLSTER (ELECTROSURGICAL) ×3 IMPLANT
RTRCTR C-SECT PINK 25CM LRG (MISCELLANEOUS) ×3 IMPLANT
STRIP CLOSURE SKIN 1/2X4 (GAUZE/BANDAGES/DRESSINGS) ×2 IMPLANT
SUT VIC AB 0 CTX 36 (SUTURE) ×12
SUT VIC AB 0 CTX36XBRD ANBCTRL (SUTURE) ×3 IMPLANT
SUT VIC AB 2-0 CT1 27 (SUTURE) ×3
SUT VIC AB 2-0 CT1 TAPERPNT 27 (SUTURE) ×1 IMPLANT
SUT VIC AB 4-0 KS 27 (SUTURE) ×3 IMPLANT
TOWEL OR 17X24 6PK STRL BLUE (TOWEL DISPOSABLE) ×3 IMPLANT
TRAY FOLEY W/BAG SLVR 14FR LF (SET/KITS/TRAYS/PACK) ×3 IMPLANT
WATER STERILE IRR 1000ML POUR (IV SOLUTION) ×3 IMPLANT

## 2021-05-22 NOTE — Anesthesia Procedure Notes (Signed)
Spinal ° °Patient location during procedure: OR °Reason for block: surgical anesthesia °Staffing °Performed: anesthesiologist  °Anesthesiologist: Brixton Schnapp E, MD °Preanesthetic Checklist °Completed: patient identified, IV checked, risks and benefits discussed, surgical consent, monitors and equipment checked, pre-op evaluation and timeout performed °Spinal Block °Patient position: sitting °Prep: DuraPrep and site prepped and draped °Patient monitoring: continuous pulse ox, blood pressure and heart rate °Approach: midline °Location: L3-4 °Injection technique: single-shot °Needle °Needle type: Pencan  °Needle gauge: 24 G °Needle length: 10 cm °Assessment °Events: CSF return °Additional Notes °Functioning IV was confirmed and monitors were applied. Sterile prep and drape, including hand hygiene and sterile gloves were used. The patient was positioned and the spine was prepped. The skin was anesthetized with lidocaine.  Free flow of clear CSF was obtained prior to injecting local anesthetic into the CSF. The needle was carefully withdrawn. The patient tolerated the procedure well.  ° ° ° °

## 2021-05-22 NOTE — MAU Note (Signed)
Presents with c/o LOF that began last night @ approx 1900, reports fluid is pink tinged.  Denies VB.  Reports irregular ctxs, 10-15 minutes apart.   States scheduled for repeat C/S tomorrow @ 1230.  Labs drawn today, but needs Covid swab per pt.

## 2021-05-22 NOTE — H&P (Signed)
History     CSN: [id number]  Arrival date and time: 05/22/21 1040   Event Date/Time   First Provider Initiated Contact with Patient 05/22/21 1125      Chief Complaint  Patient presents with   Rupture of Membranes   HPI This is a 30 yo G4P2012 at [redacted]w[redacted]d with history of 2 prior cesarean deliveries and insufficient prenatal care. She is well known to me. She presents complaining of leaking fluid that started last night. Having some cramping and light contractions. She came for evaluation today as she has continued to leak fluid.  OB History     Gravida  4   Para  2   Term  2   Preterm      AB  1   Living  2      SAB  1   IAB      Ectopic      Multiple  0   Live Births  2           Past Medical History:  Diagnosis Date   Anemia    Medical history non-contributory    Syphilis     Past Surgical History:  Procedure Laterality Date   CESAREAN SECTION     CESAREAN SECTION     CESAREAN SECTION N/A 01/10/2020   Procedure: CESAREAN SECTION;  Surgeon: Levie Heritage, DO;  Location: [facility]S;  Service: Obstetrics;  Laterality: N/A;    Family History  Problem Relation Age of Onset   Hypertension Mother    Healthy Father    Hypertension Maternal Grandmother     Social History   Tobacco Use   Smoking status: Every Day    Packs/day: 1.00    Types: Cigarettes   Smokeless tobacco: Never  Substance Use Topics   Alcohol use: Not Currently    Comment: occ   Drug use: Not Currently    Types: Marijuana    Comment: LAST SMOKED 09-2020    Allergies: No Known Allergies  Medications Prior to Admission  Medication Sig Dispense Refill Last Dose   acetaminophen (TYLENOL) 500 MG tablet Take 500 mg by mouth every 6 (six) hours as needed for moderate pain.      calcium carbonate (TUMS - DOSED IN MG ELEMENTAL CALCIUM) 500 MG chewable tablet Chew 1 tablet by mouth every 4 (four) hours as needed for indigestion.   05/20/2021   Multiple Vitamins-Minerals (WOMENS  MULTI) CAPS Take 1 capsule by mouth daily.   05/20/2021   polyethylene glycol (MIRALAX / GLYCOLAX) 17 g packet Take 17 g by mouth daily as needed for moderate constipation.   05/20/2021   Prenatal Multivit-Min-FA ([employer] PRENATAL GUMMY) 0.4 MG CHEW Chew 1 Dose by mouth daily. (Patient not taking: Reported on 05/18/2021) 90 tablet 3     Review of Systems Physical Exam   Blood pressure 116/72, pulse 94, temperature 97.8 F (36.6 C), resp. rate 18, height 5\' 4"  (1.626 m), weight 78.9 kg, last menstrual period 08/10/2020, SpO2 99 %, not currently breastfeeding.  Physical Exam Vitals reviewed. Exam conducted with a chaperone present.  Constitutional:      Appearance: Normal appearance.  Cardiovascular:     Rate and Rhythm: Normal rate and regular rhythm.  Pulmonary:     Effort: Pulmonary effort is normal.     Breath sounds: Normal breath sounds.  Genitourinary:    Comments: Patient grossly ruptured on speculum exam. Neurological:     Mental Status: She is alert.  Psychiatric:  Mood and Affect: Mood normal.        Behavior: Behavior normal.        Thought Content: Thought content normal.        Judgment: Judgment normal.    MAU Course  Procedures  MDM  Assessment and Plan   The risks of cesarean section discussed with the patient included but were not limited to: bleeding which may require transfusion or reoperation; infection which may require antibiotics; injury to bowel, bladder, ureters or other surrounding organs; injury to the fetus; need for additional procedures including hysterectomy in the event of a life-threatening hemorrhage; placental abnormalities wth subsequent pregnancies, incisional problems, thromboembolic phenomenon and other postoperative/anesthesia complications. The patient concurred with the proposed plan, giving informed written consent for the procedure.   Patient has been NPO since 830 am, she will remain NPO for procedure. Anesthesia and OR aware.   Preoperative prophylactic Ancef and azithromycin ordered on call to the OR.  To OR when ready.  Breast feeding IUD for birth control   Levie Heritage 05/22/2021, 11:35 AM

## 2021-05-22 NOTE — Op Note (Addendum)
Christina Woodard PROCEDURE DATE: 05/22/2021  PREOPERATIVE DIAGNOSIS: Intrauterine pregnancy at  [redacted]w[redacted]d weeks gestation;  prior cesarean section, elective repeat CS , Contraceptive management  POSTOPERATIVE DIAGNOSIS: The same  PROCEDURE:  Low Transverse Cesarean Section  SURGEON:  Dr. Candelaria Celeste  ASSISTANT: Dr. Warner Mccreedy  INDICATIONS: Christina Woodard is a 30 y.o. [id number] at [redacted]w[redacted]d scheduled for cesarean section secondary to  repeat cesarean section .  The risks of cesarean section discussed with the patient included but were not limited to: bleeding which may require transfusion or reoperation; infection which may require antibiotics; injury to bowel, bladder, ureters or other surrounding organs; injury to the fetus; need for additional procedures including hysterectomy in the event of a life-threatening hemorrhage; placental abnormalities wth subsequent pregnancies, incisional problems, thromboembolic phenomenon and other postoperative/anesthesia complications. The patient concurred with the proposed plan, giving informed written consent for the procedure.    FINDINGS:  Viable female infant in cephalic presentation.  Apgars 8 and 9, weight, 3420gm.  Clear amniotic fluid.  Intact placenta, three vessel cord.  Normal uterus, fallopian tubes and ovaries bilaterally.  ANESTHESIA:    Spinal INTRAVENOUS FLUIDS:1000 ml ESTIMATED BLOOD LOSS: 133 ml URINE OUTPUT:  50 ml SPECIMENS: Placenta sent to L&D COMPLICATIONS: None immediate  PROCEDURE IN DETAIL:  The patient received intravenous antibiotics and had sequential compression devices applied to her lower extremities while in the preoperative area.  She was then taken to the operating room where spinal anesthesia was administered (epidural anesthesia was dosed up to surgical level) and was found to be adequate. She was then placed in a dorsal supine position with a leftward tilt, and prepped and draped in a sterile manner.  A foley catheter was  placed into her bladder and attached to constant gravity, which drained clear fluid throughout.  After an adequate timeout was performed, a Pfannenstiel skin incision was made with scalpel and carried through to the underlying layer of fascia. The fascia was incised in the midline and this incision was extended bilaterally using the Mayo scissors. Kocher clamps were applied to the superior aspect of the fascial incision and the underlying rectus muscles were dissected off bluntly. A similar process was carried out on the inferior aspect of the facial incision. The rectus muscles were separated in the midline bluntly and the peritoneum was entered bluntly. An Alexis retractor was placed to aid in visualization of the uterus.  Attention was turned to the lower uterine segment where a transverse hysterotomy was made with a scalpel and extended bilaterally bluntly. The infant was successfully delivered, and cord was clamped and cut and infant was handed over to awaiting neonatology team. Uterine massage was then administered and the placenta delivered intact with three-vessel cord. The uterus was then cleared of clot and debris.    At that time the post placental liletta IUD was successfully placed at the fundus and the strings were guided through the cervix with a kelly clamp.  Upon completion of placement of the IUD, the hysterotomy was closed with 0 Vicryl in a running locked fashion, and an imbricating layer was also placed with a 0 Vicryl. Overall, excellent hemostasis was noted. The abdomen and the pelvis were cleared of all clot and debris and the Jon Gills was removed. Hemostasis was confirmed on all surfaces.  The peritoneum was reapproximated using 2-0 vicryl running stitches. The fascia was then closed using 0 Vicryl in a running fashion. The skin was closed with 4-0 vicryl. The patient tolerated the procedure well.  Sponge, lap, instrument and needle counts were correct x 2. She was taken to the recovery  room in stable condition.   Warner Mccreedy, MD, MPH OB Fellow, Faculty [facility]

## 2021-05-22 NOTE — Anesthesia Postprocedure Evaluation (Signed)
Anesthesia Post Note  Patient: Janari B Schlachter  Procedure(s) Performed: CESAREAN SECTION WITH MIRENA PLACEMENT     Patient location during evaluation: PACU Anesthesia Type: Spinal Level of consciousness: awake and alert Pain management: pain level controlled Vital Signs Assessment: post-procedure vital signs reviewed and stable Respiratory status: spontaneous breathing, nonlabored ventilation and respiratory function stable Cardiovascular status: blood pressure returned to baseline and stable Postop Assessment: no apparent nausea or vomiting Anesthetic complications: no   No notable events documented.  Last Vitals:  Vitals:   05/22/21 1702 05/22/21 1715  BP: 100/67 103/71  Pulse: 82 71  Resp: 17 18  Temp:    SpO2: 100% 100%    Last Pain:  Vitals:   05/22/21 1700  TempSrc:   PainSc: 0-No pain   Pain Goal:                Epidural/Spinal Function Cutaneous sensation: Vague (05/22/21 1700), Patient able to flex knees: No (05/22/21 1700), Patient able to lift hips off bed: No (05/22/21 1700), Back pain beyond tenderness at insertion site: No (05/22/21 1700), Progressively worsening motor and/or sensory loss: No (05/22/21 1700), Bowel and/or bladder incontinence post epidural: No (05/22/21 1700)  Lucretia Kern

## 2021-05-22 NOTE — Anesthesia Preprocedure Evaluation (Addendum)
Anesthesia Evaluation  Patient identified by MRN, date of birth, ID band Patient awake    Reviewed: Allergy & Precautions, NPO status , Patient's Chart, lab work & pertinent test results  History of Anesthesia Complications Negative for: history of anesthetic complications  Airway Mallampati: II  TM Distance: >3 FB Neck ROM: Full    Dental   Pulmonary Current Smoker,    Pulmonary exam normal        Cardiovascular negative cardio ROS Normal cardiovascular exam     Neuro/Psych negative neurological ROS     GI/Hepatic negative GI ROS, Neg liver ROS,   Endo/Other  negative endocrine ROS  Renal/GU negative Renal ROS  negative genitourinary   Musculoskeletal negative musculoskeletal ROS (+)   Abdominal   Peds  Hematology  (+) anemia ,   Anesthesia Other Findings   Reproductive/Obstetrics (+) Pregnancy (Prior C/S x2)                           Anesthesia Physical Anesthesia Plan  ASA: 2  Anesthesia Plan: Spinal   Post-op Pain Management:    Induction:   PONV Risk Score and Plan: 2 and Ondansetron and Treatment may vary due to age or medical condition  Airway Management Planned: Natural Airway  Additional Equipment: None  Intra-op Plan:   Post-operative Plan:   Informed Consent: I have reviewed the patients History and Physical, chart, labs and discussed the procedure including the risks, benefits and alternatives for the proposed anesthesia with the patient or authorized representative who has indicated his/her understanding and acceptance.       Plan Discussed with:   Anesthesia Plan Comments: ( 30 y.o. [id number] at [redacted]w[redacted]d  Lab Results      Component                Value               Date/Time                 HGB                      10.9 (L)            05/22/2021 10:26 AM       HGB                      10.1 (L)            03/31/2021 11:34 AM       PLT                       283                 05/22/2021 10:26 AM       PLT                      259                 03/31/2021 11:34 AM   Past Surgical History: No date: CESAREAN SECTION No date: CESAREAN SECTION 01/10/2020: CESAREAN SECTION; N/A     Comment:  Procedure: CESAREAN SECTION;  Surgeon: Levie Heritage,              DO;  Location: [facility];  Service: Obstetrics;                Laterality: N/A;)  Anesthesia Quick Evaluation  

## 2021-05-22 NOTE — MAU Provider Note (Addendum)
History     CSN: [id number]  Arrival date and time: 05/22/21 1040   Event Date/Time   First Provider Initiated Contact with Patient 05/22/21 1125      Chief Complaint  Patient presents with   Rupture of Membranes   HPI This is a 30 yo G4P2012 at [redacted]w[redacted]d with history of 2 prior cesarean deliveries and insufficient prenatal care. She is well known to me. She presents complaining of leaking fluid that started last night. Having some cramping and light contractions. She came for evaluation today as she has continued to leak fluid.  OB History     Gravida  4   Para  2   Term  2   Preterm      AB  1   Living  2      SAB  1   IAB      Ectopic      Multiple  0   Live Births  2           Past Medical History:  Diagnosis Date   Anemia    Medical history non-contributory    Syphilis     Past Surgical History:  Procedure Laterality Date   CESAREAN SECTION     CESAREAN SECTION     CESAREAN SECTION N/A 01/10/2020   Procedure: CESAREAN SECTION;  Surgeon: Levie Heritage, DO;  Location: [facility];  Service: Obstetrics;  Laterality: N/A;    Family History  Problem Relation Age of Onset   Hypertension Mother    Healthy Father    Hypertension Maternal Grandmother     Social History   Tobacco Use   Smoking status: Every Day    Packs/day: 1.00    Types: Cigarettes   Smokeless tobacco: Never  Substance Use Topics   Alcohol use: Not Currently    Comment: occ   Drug use: Not Currently    Types: Marijuana    Comment: LAST SMOKED 09-2020    Allergies: No Known Allergies  Medications Prior to Admission  Medication Sig Dispense Refill Last Dose   acetaminophen (TYLENOL) 500 MG tablet Take 500 mg by mouth every 6 (six) hours as needed for moderate pain.      calcium carbonate (TUMS - DOSED IN MG ELEMENTAL CALCIUM) 500 MG chewable tablet Chew 1 tablet by mouth every 4 (four) hours as needed for indigestion.   05/20/2021   Multiple Vitamins-Minerals (WOMENS  MULTI) CAPS Take 1 capsule by mouth daily.   05/20/2021   polyethylene glycol (MIRALAX / GLYCOLAX) 17 g packet Take 17 g by mouth daily as needed for moderate constipation.   05/20/2021   Prenatal Multivit-Min-FA ([employer]S PRENATAL GUMMY) 0.4 MG CHEW Chew 1 Dose by mouth daily. (Patient not taking: Reported on 05/18/2021) 90 tablet 3     Review of Systems Physical Exam   Blood pressure 116/72, pulse 94, temperature 97.8 F (36.6 C), resp. rate 18, height 5\' 4"  (1.626 m), weight 78.9 kg, last menstrual period 08/10/2020, SpO2 99 %, not currently breastfeeding.  Physical Exam Vitals reviewed. Exam conducted with a chaperone present.  Constitutional:      Appearance: Normal appearance.  Cardiovascular:     Rate and Rhythm: Normal rate and regular rhythm.  Pulmonary:     Effort: Pulmonary effort is normal.     Breath sounds: Normal breath sounds.  Genitourinary:    Comments: Patient grossly ruptured on speculum exam. Neurological:     Mental Status: She is alert.  Psychiatric:  Mood and Affect: Mood normal.        Behavior: Behavior normal.        Thought Content: Thought content normal.        Judgment: Judgment normal.    MAU Course  Procedures  MDM  Assessment and Plan   The risks of cesarean section discussed with the patient included but were not limited to: bleeding which may require transfusion or reoperation; infection which may require antibiotics; injury to bowel, bladder, ureters or other surrounding organs; injury to the fetus; need for additional procedures including hysterectomy in the event of a life-threatening hemorrhage; placental abnormalities wth subsequent pregnancies, incisional problems, thromboembolic phenomenon and other postoperative/anesthesia complications. The patient concurred with the proposed plan, giving informed written consent for the procedure.   Patient has been NPO since 830 am, she will remain NPO for procedure. Anesthesia and OR aware.   Preoperative prophylactic Ancef and azithromycin ordered on call to the OR.  To OR when ready.  Levie Heritage, DO 05/22/2021 11:39 AM  Levie Heritage 05/22/2021, 11:35 AM

## 2021-05-22 NOTE — Discharge Summary (Signed)
Postpartum Discharge Summary  Date of Service updated     Patient Name: Christina Woodard DOB: 05/26/1991 MRN: [id number]  Date of admission: 05/22/2021 Delivery date:05/22/2021  Delivering provider: Truett Mainland  Date of discharge: 05/24/2021  Admitting diagnosis: Encounter for supervision of high risk pregnancy in third trimester, antepartum [O09.93] Intrauterine pregnancy: [redacted]w[redacted]d    Secondary diagnosis:  Active Problems:   History of cesarean delivery   Status post cesarean section   Supervision of other normal pregnancy, antepartum   GBS (group B streptococcus) UTI complicating pregnancy   Encounter for supervision of high risk pregnancy in third trimester, antepartum  Additional problems: None    Discharge diagnosis: Term Pregnancy Delivered                                              Post partum procedures:  None  Augmentation: N/A Complications: None  Hospital course: Unscheduled C/S   30y.o. yo [id number] admitted to the hospital 05/22/2021 for scheduled cesarean section with the following indication: SROM and desire repeat CS .Delivery details are as follows:  Membrane Rupture Time/Date: 3:53 PM ,05/22/2021   Delivery Method:C-Section, Low Transverse  Details of operation can be found in separate operative note.  Patient had an uncomplicated postpartum course.  She is ambulating, tolerating a regular diet, passing flatus, and urinating well. Patient is discharged home in stable condition on  05/24/21        Newborn Data: Birth date:05/22/2021  Birth time:3:56 PM  Gender:Female  Living status:Living  Apgars:8 ,9  Weight:3420 g     Magnesium Sulfate received: No BMZ received: No Rhophylac:N/A MMR:N/A T-DaP:Given prenatally Flu: No Transfusion:No  Physical exam  Vitals:   05/23/21 0530 05/23/21 1400 05/23/21 2221 05/24/21 0512  BP: 118/77 118/69 114/80 115/83  Pulse: 67 62 84 66  Resp: _0 Temp: 98.1 F (36.7 C) 97.7 F (36.5  C) 97.8 F (36.6 C) 98 F (36.7 C)  TempSrc:  Oral Oral Oral  SpO2: 100% 100% 100%   Weight:      Height:       General: alert, cooperative, and no distress Lochia: appropriate Uterine Fundus: firm Incision: No significant erythema, Dressing is clean, dry, and intact DVT Evaluation: No significant calf/ankle edema. Labs: Lab Results  Component Value Date   WBC 13.3 (H) 05/23/2021   HGB 9.9 (L) 05/23/2021   HCT 31.4 (L) 05/23/2021   MCV 85.8 05/23/2021   PLT 256 05/23/2021   CMP Latest Ref Rng & Units 08/25/2019  Glucose 70 - 99 mg/dL 86  BUN 6 - 20 mg/dL 7  Creatinine 0.44 - 1.00 mg/dL 0.52  Sodium 135 - 145 mmol/L 133(L)  Potassium 3.5 - 5.1 mmol/L 3.9  Chloride 98 - 111 mmol/L 105  CO2 22 - 32 mmol/L 21(L)  Calcium 8.9 - 10.3 mg/dL 8.5(L)  Total Protein 6.5 - 8.1 g/dL 6.7  Total Bilirubin 0.3 - 1.2 mg/dL 0.4  Alkaline Phos 38 - 126 U/L 51  AST 15 - 41 U/L 13(L)  ALT 0 - 44 U/L 10   Edinburgh Score: Edinburgh Postnatal Depression Scale Screening Tool 05/23/2021  I have been able to laugh and see the funny side of things. 0  I have looked forward with enjoyment to things. 0  I have blamed myself unnecessarily when things  went wrong. 2  I have been anxious or worried for no good reason. 1  I have felt scared or panicky for no good reason. 0  Things have been getting on top of me. 0  I have been so unhappy that I have had difficulty sleeping. 1  I have felt sad or miserable. 1  I have been so unhappy that I have been crying. 1  The thought of harming myself has occurred to me. 0  Edinburgh Postnatal Depression Scale Total 6     After visit meds:  Allergies as of 05/24/2021   No Known Allergies      Medication List     TAKE these medications    acetaminophen 500 MG tablet Commonly known as: TYLENOL Take 500 mg by mouth every 6 (six) hours as needed for moderate pain.   calcium carbonate 500 MG chewable tablet Commonly known as: TUMS - dosed in mg  elemental calcium Chew 1 tablet by mouth every 4 (four) hours as needed for indigestion.   CVS Prenatal Gummy 0.4 MG Chew Chew 1 Dose by mouth daily.   ibuprofen 600 MG tablet Commonly known as: ADVIL Take 1 tablet (600 mg total) by mouth every 6 (six) hours as needed for mild pain.   oxyCODONE 5 MG immediate release tablet Commonly known as: Oxy IR/ROXICODONE Take 1 tablet (5 mg total) by mouth every 6 (six) hours as needed for severe pain.   polyethylene glycol 17 g packet Commonly known as: MIRALAX / GLYCOLAX Take 17 g by mouth daily as needed for moderate constipation.   Womens [employer] Take 1 capsule by mouth daily.         Discharge home in stable condition Infant Feeding: Breast Infant Disposition:home with mother Discharge instruction: per After Visit Summary and Postpartum booklet. Activity: Advance as tolerated. Pelvic rest for 6 weeks.  Diet: routine diet Future Appointments: Future Appointments  Date Time Provider [place]  06/09/2021  9:15 AM Truett Mainland, DO [facility] None  07/06/2021 10:35 AM Nehemiah Settle Tanna Savoy, DO [facility] None   Follow up Visit: Message sent to [facility] by Dr. Cy Blamer on 11/14  Please schedule this patient for a In person postpartum visit in 4 weeks with the following provider:  Dr. Nehemiah Settle . Additional Postpartum F/U:Incision check 1 week  Low risk pregnancy complicated by:  None Delivery mode:  C-Section, Low Transverse  Anticipated Birth Control:  PP IUD placed (Do NOT disclose IUD to family/support people)   05/24/2021 Patriciaann Clan, DO

## 2021-05-22 NOTE — Transfer of Care (Signed)
Immediate Anesthesia Transfer of Care Note  Patient: Christina Woodard  Procedure(s) Performed: CESAREAN SECTION WITH MIRENA PLACEMENT  Patient Location: PACU  Anesthesia Type:Spinal  Level of Consciousness: awake, alert , oriented and patient cooperative  Airway & Oxygen Therapy: Patient Spontanous Breathing  Post-op Assessment: Report given to RN and Post -op Vital signs reviewed and stable  Post vital signs: Reviewed and stable  Last Vitals:  Vitals Value Taken Time  BP    Temp    Pulse 87 05/22/21 1645  Resp 9 05/22/21 1645  SpO2 93 % 05/22/21 1645  Vitals shown include unvalidated device data.  Last Pain:  Vitals:   05/22/21 1054  PainSc: 7          Complications: No notable events documented.

## 2021-05-23 ENCOUNTER — Encounter (HOSPITAL_COMMUNITY): Payer: Self-pay | Admitting: Family Medicine

## 2021-05-23 ENCOUNTER — Inpatient Hospital Stay (HOSPITAL_COMMUNITY): Admission: RE | Admit: 2021-05-23 | Payer: Medicaid Other | Source: Home / Self Care | Admitting: Family Medicine

## 2021-05-23 DIAGNOSIS — Z30014 Encounter for initial prescription of intrauterine contraceptive device: Secondary | ICD-10-CM

## 2021-05-23 LAB — CBC
HCT: 31.4 % — ABNORMAL LOW (ref 36.0–46.0)
Hemoglobin: 9.9 g/dL — ABNORMAL LOW (ref 12.0–15.0)
MCH: 27 pg (ref 26.0–34.0)
MCHC: 31.5 g/dL (ref 30.0–36.0)
MCV: 85.8 fL (ref 80.0–100.0)
Platelets: 256 10*3/uL (ref 150–400)
RBC: 3.66 MIL/uL — ABNORMAL LOW (ref 3.87–5.11)
RDW: 14.1 % (ref 11.5–15.5)
WBC: 13.3 10*3/uL — ABNORMAL HIGH (ref 4.0–10.5)
nRBC: 0 % (ref 0.0–0.2)

## 2021-05-23 MED ORDER — CALCIUM CARBONATE ANTACID 500 MG PO CHEW
1.0000 | CHEWABLE_TABLET | Freq: Three times a day (TID) | ORAL | Status: DC | PRN
  Administered 2021-05-23: 200 mg via ORAL
  Filled 2021-05-23: qty 1

## 2021-05-23 MED ORDER — INFLUENZA VAC SPLIT QUAD 0.5 ML IM SUSY
0.5000 mL | PREFILLED_SYRINGE | INTRAMUSCULAR | Status: AC
  Administered 2021-05-24: 0.5 mL via INTRAMUSCULAR
  Filled 2021-05-23: qty 0.5

## 2021-05-23 NOTE — Progress Notes (Signed)
CSW received consult for hx of marijuana use.  Referral was screened out due to the following: ~MOB had no documented substance use after initial prenatal visit/+UPT. ~MOB had no positive drug screens after initial prenatal visit/+UPT. ~Baby's UDS is negative.  Please consult CSW if current concerns arise or by MOB's request.  CSW received consult for late and limited PNC.  CSW reviewed chart and is screening out consult as it does not meet criteria for automatic CSW involvement and infant drug screening.  MOB started care prior to 28 weeks and had more than 3 visits.  Please contact CSW if current concerns arise or by MOB's request.   Blaine Hamper, MSW, LCSW Clinical Social Work [phone]

## 2021-05-24 ENCOUNTER — Encounter: Payer: Medicaid Other | Admitting: Family Medicine

## 2021-05-24 ENCOUNTER — Other Ambulatory Visit (HOSPITAL_COMMUNITY): Payer: Self-pay

## 2021-05-24 MED ORDER — OXYCODONE HCL 5 MG PO TABS
5.0000 mg | ORAL_TABLET | Freq: Four times a day (QID) | ORAL | 0 refills | Status: AC | PRN
  Filled 2021-05-24: qty 10, 3d supply, fill #0

## 2021-05-24 MED ORDER — IBUPROFEN 600 MG PO TABS
600.0000 mg | ORAL_TABLET | Freq: Four times a day (QID) | ORAL | 0 refills | Status: DC | PRN
Start: 2021-05-24 — End: 2021-12-12
  Filled 2021-05-24: qty 60, 15d supply, fill #0

## 2021-05-24 NOTE — Progress Notes (Signed)
Subjective: Postpartum Day 1: Cesarean Delivery Patient reports incisional pain, tolerating PO, and no problems voiding.    Objective: Vital signs in last 24 hours: Temp:  [97.7 F (36.5 C)-98 F (36.7 C)] 98 F (36.7 C) (11/16 0512) Pulse Rate:  [62-84] 66 (11/16 0512) Resp:  [17-18] 18 (11/16 0512) BP: (114-118)/(69-83) 115/83 (11/16 0512) SpO2:  [100 %] 100 % (11/15 2221)  Physical Exam:  General: alert, cooperative, and no distress Lochia: appropriate Uterine Fundus: firm Incision: healing well DVT Evaluation: No evidence of DVT seen on physical exam. Negative Homan's sign.  Recent Labs    05/22/21 1026 05/23/21 0452  HGB 10.9* 9.9*  HCT 33.5* 31.4*    Assessment/Plan: Status post Cesarean section. Doing well postoperatively.  Continue current care.  Levie Heritage

## 2021-06-02 ENCOUNTER — Telehealth (HOSPITAL_COMMUNITY): Payer: Self-pay | Admitting: *Deleted

## 2021-06-02 IMAGING — US US OB COMP LESS 14 WK
1 series · 15 of 28 positions shown · non-contrast
Comparison: None.

CLINICAL DATA: Two weeks of right-sided abdominal cramping.

EXAM:
OBSTETRIC <14 WK ULTRASOUND
TECHNIQUE: Transabdominal ultrasound was performed for evaluation of the
gestation as well as the maternal uterus and adnexal regions.

[Series 1: us ob comp less 14 wk · 32 acquisitions, 15 frames shown]
[im 1/32]
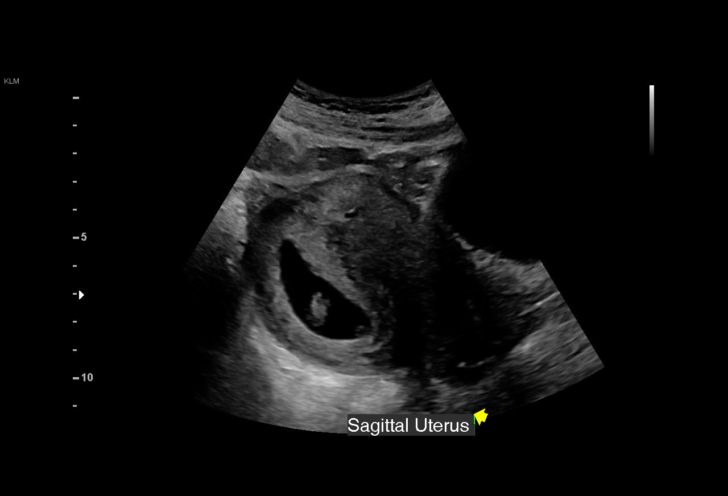
[im 3/32]
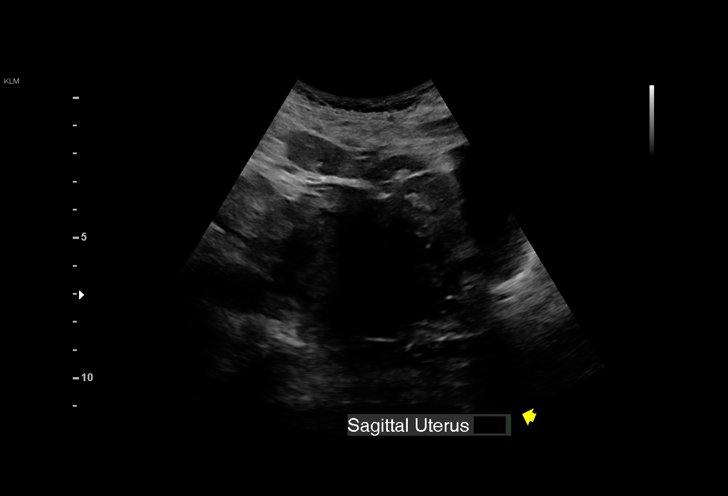
[im 5/32]
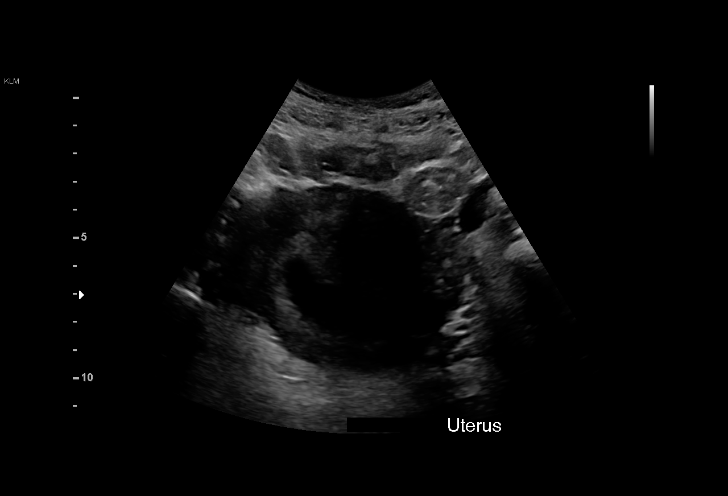
[im 7/32]
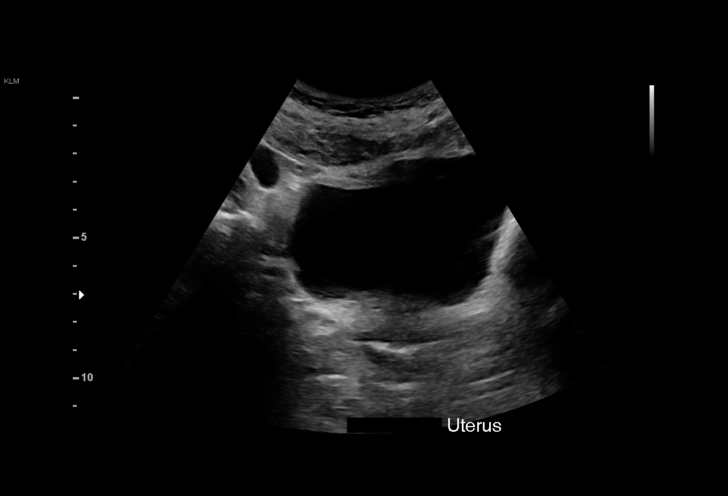
[im 10/32]
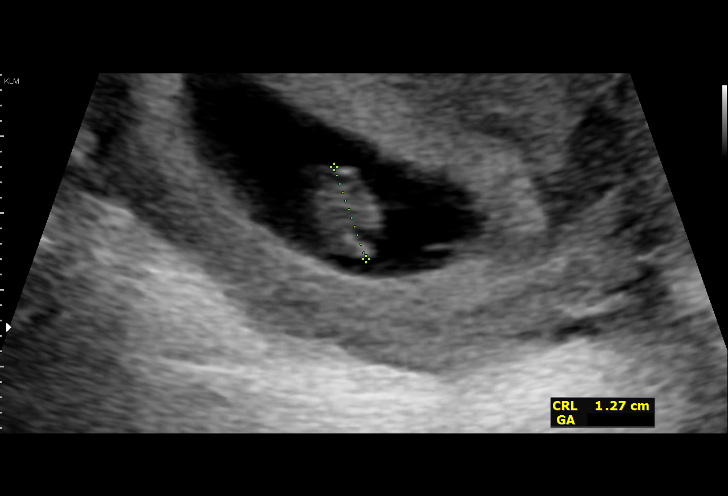
[im 12/32]
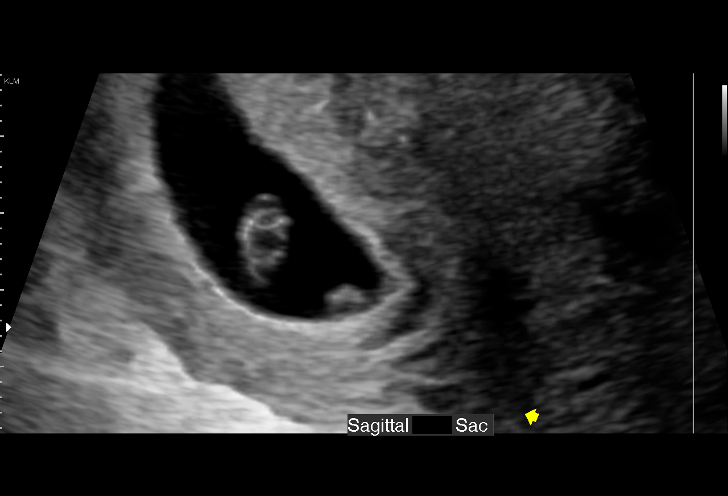
[im 14/32]
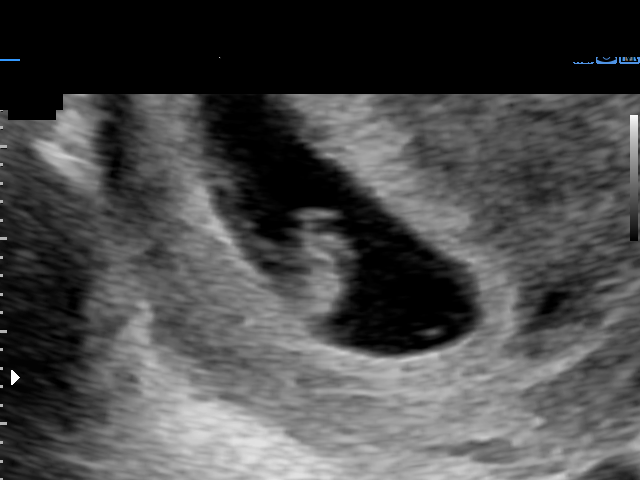
[im 17/32]
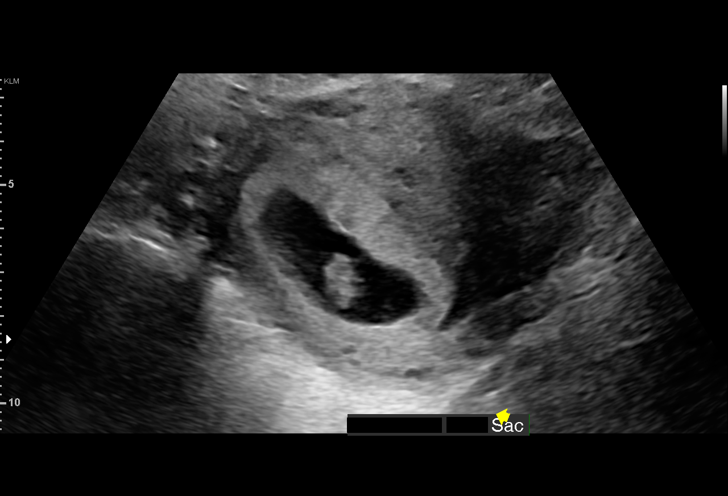
[im 18/32]
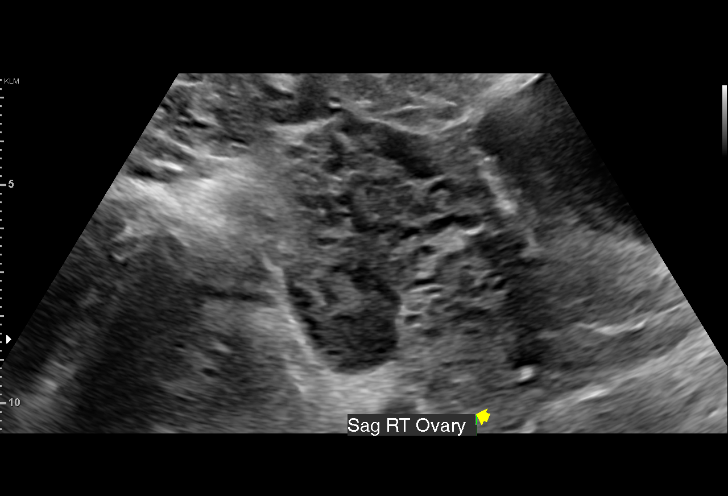
[im 20/32]
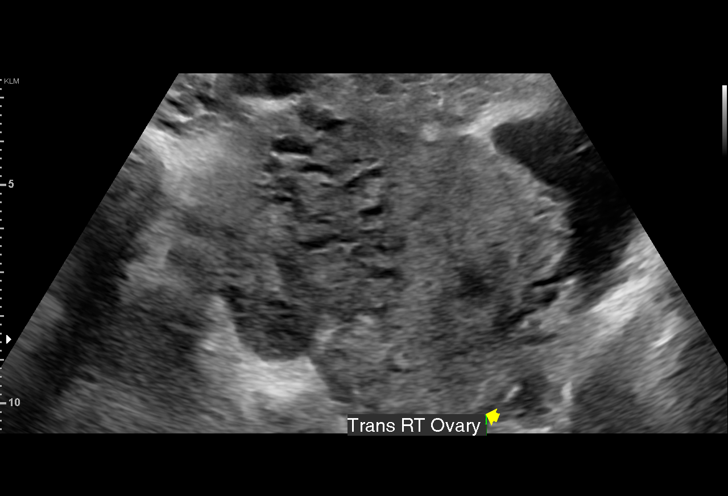
[im 22/32]
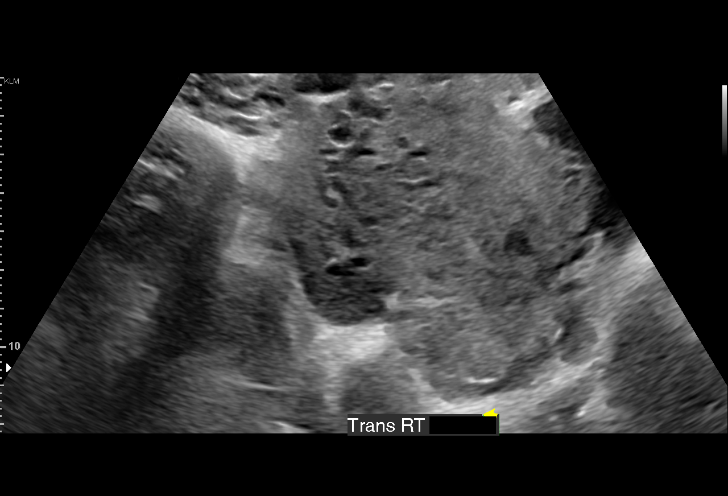
[im 25/32]
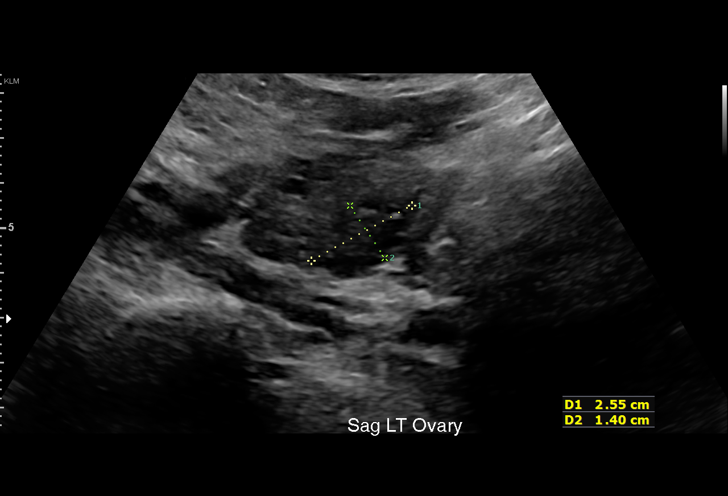
[im 27/32]
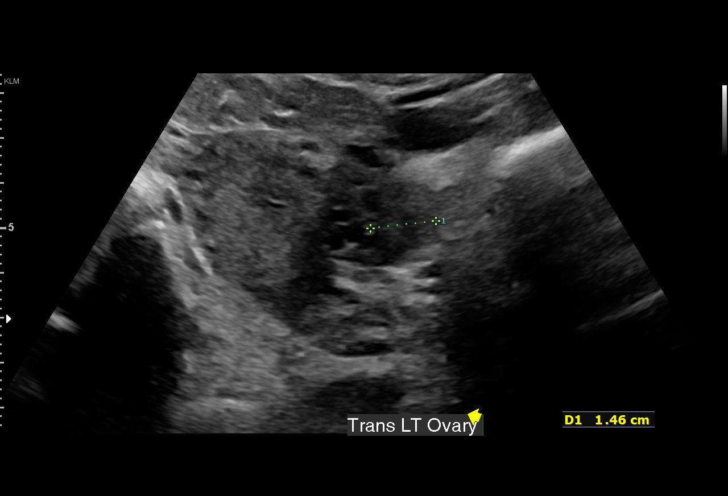
[im 29/32]
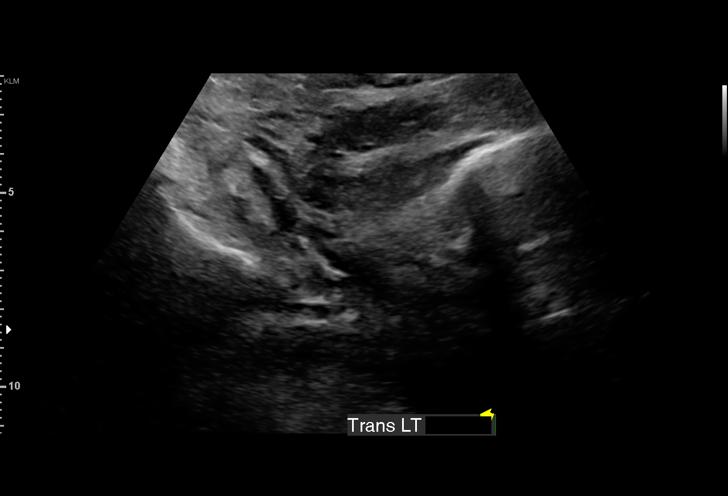
[im 32/32]
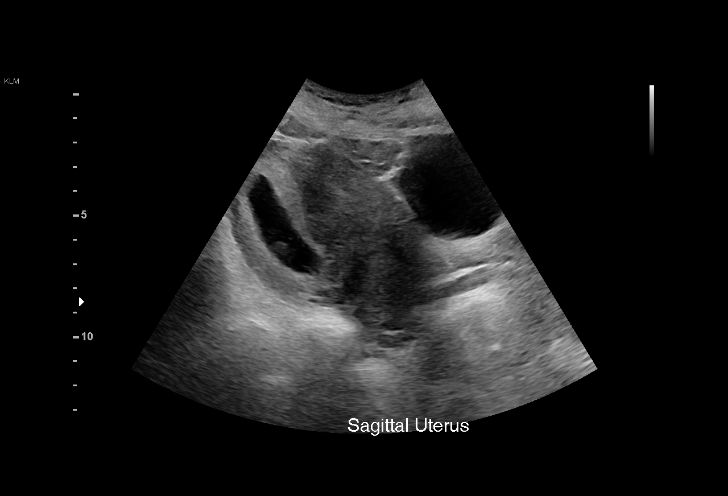

[15 of 28 positions shown; findings below may reference images not displayed]

FINDINGS: Intrauterine gestational sac: Single

Yolk sac:  Visualized.

Embryo:  Visualized.

Cardiac Activity: Visualized.

Heart Rate: 171 bpm

CRL: 12.5 mm mm   7 w 3 d                  US EDC: May 27, 2021

Subchorionic hemorrhage:  None visualized.

Maternal uterus/adnexae: Within normal limits
IMPRESSION: Single viable intrauterine pregnancy as described above.

## 2021-06-02 NOTE — Telephone Encounter (Signed)
Phone voicemail message left to return nurse call.  Duffy Rhody, RN 06-02-2021 at 11:37am

## 2021-06-09 ENCOUNTER — Ambulatory Visit: Payer: Medicaid Other | Admitting: Family Medicine

## 2021-06-12 ENCOUNTER — Ambulatory Visit: Payer: Medicaid Other | Admitting: Obstetrics & Gynecology

## 2021-06-16 ENCOUNTER — Ambulatory Visit (INDEPENDENT_AMBULATORY_CARE_PROVIDER_SITE_OTHER): Payer: Medicaid Other | Admitting: Obstetrics and Gynecology

## 2021-06-16 ENCOUNTER — Other Ambulatory Visit: Payer: Self-pay

## 2021-06-16 ENCOUNTER — Encounter: Payer: Self-pay | Admitting: Obstetrics and Gynecology

## 2021-06-16 VITALS — BP 136/95 | HR 72 | Wt 161.0 lb

## 2021-06-16 DIAGNOSIS — O165 Unspecified maternal hypertension, complicating the puerperium: Secondary | ICD-10-CM | POA: Diagnosis not present

## 2021-06-16 MED ORDER — HYDROCHLOROTHIAZIDE 25 MG PO TABS: 25.0000 mg | ORAL_TABLET | Freq: Every day | ORAL | 3 refills | Status: AC

## 2021-06-16 NOTE — Progress Notes (Signed)
30 yo P3 s/p RCS on 11/14 here for incision check. Patient reports doing well. She admits to not having a lot of support during the week but is able to recharge over the weekend when friends help. She reports some incisional pain without drainage. She is not taking ibuprofen or tylenol as she wasn't sure if it was safe with breastfeeding. Patient is without any other complaints  Past Medical History:  Diagnosis Date   Anemia    Medical history non-contributory    Syphilis    Past Surgical History:  Procedure Laterality Date   CESAREAN SECTION     CESAREAN SECTION     CESAREAN SECTION N/A 01/10/2020   Procedure: CESAREAN SECTION;  Surgeon: Levie Heritage, DO;  Location: [facility];  Service: Obstetrics;  Laterality: N/A;   CESAREAN SECTION N/A 05/22/2021   Procedure: CESAREAN SECTION WITH MIRENA PLACEMENT;  Surgeon: Levie Heritage, DO;  Location: [facility];  Service: Obstetrics;  Laterality: N/A;   Family History  Problem Relation Age of Onset   Hypertension Mother    Healthy Father    Hypertension Maternal Grandmother    Social History   Tobacco Use   Smoking status: Every Day    Packs/day: 1.00    Types: Cigarettes   Smokeless tobacco: Never  Substance Use Topics   Alcohol use: Not Currently    Comment: occ   Drug use: Not Currently    Types: Marijuana    Comment: LAST SMOKED 09-2020   ROS  See pertinent in HPI. All other systems reviewed and non contributory Blood pressure (!) 136/95, pulse 72, weight 161 lb (73 kg), currently breastfeeding. GENERAL: Well-developed, well-nourished female in no acute distress.  ABDOMEN: Soft, nontender, nondistended. No organomegaly. Incision: healing well without erythema, induration or drainage.  EXTREMITIES: No cyanosis, clubbing, or edema, 2+ distal pulses.  A/P 30 yo here for incision check - BP elevated without symptoms- Rx HCTZ provided - wound care instructions reviewed - RTC as scheduled for postpartum visit - reviewed  postpartum depression symptoms

## 2021-07-06 ENCOUNTER — Ambulatory Visit: Payer: Medicaid Other | Admitting: Family Medicine

## 2021-07-20 ENCOUNTER — Ambulatory Visit: Payer: Medicaid Other | Admitting: Obstetrics & Gynecology

## 2021-11-13 ENCOUNTER — Encounter: Payer: Self-pay | Admitting: Family Medicine

## 2021-11-13 ENCOUNTER — Ambulatory Visit: Payer: Medicaid Other | Admitting: Family Medicine

## 2021-11-13 VITALS — BP 122/86 | HR 85 | Wt 157.0 lb

## 2021-11-13 DIAGNOSIS — Z30430 Encounter for insertion of intrauterine contraceptive device: Secondary | ICD-10-CM | POA: Diagnosis not present

## 2021-11-13 LAB — POCT URINE PREGNANCY: Preg Test, Ur: NEGATIVE

## 2021-11-13 MED ORDER — LEVONORGESTREL 20.1 MCG/DAY IU IUD
1.0000 | INTRAUTERINE_SYSTEM | Freq: Once | INTRAUTERINE | Status: AC
  Administered 2021-11-13: 1 via INTRAUTERINE

## 2021-11-13 NOTE — Progress Notes (Signed)
? ? ?  GYNECOLOGY OFFICE PROCEDURE NOTE ? ?Christina Woodard is a 31 y.o. [id number] here for [place] IUD insertion. No GYN concerns.  Last pap smear was on 10/01/2019 and was normal. Had one placed at time of last C-section and found strings and pulled this out, not knowing what it was. ? ?IUD Insertion Procedure Note ?Patient identified, informed consent performed, consent signed.   Discussed risks of irregular bleeding, cramping, infection, malpositioning or misplacement of the IUD outside the uterus which may require further procedure such as laparoscopy. Also discussed >99% contraception efficacy, increased risk of ectopic pregnancy with failure of method.   Emphasized that this did not protect against STIs, condoms recommended during all sexual encounters. Time out was performed.  Urine pregnancy test negative. ? ?Speculum placed in the vagina.  Cervix visualized.  Cleaned with Betadine x 2.  Grasped anteriorly with a single tooth tenaculum.  Uterus sounded to 9 cm.  Liletta IUD placed per manufacturer's recommendations.  Strings trimmed to 3 cm. Tenaculum was removed, good hemostasis noted.  Patient tolerated procedure well.  ? ?Patient was given post-procedure instructions.  She was advised to have backup contraception for one week.  Patient was also asked to check IUD strings periodically and follow up in 4 weeks for IUD check. ? ?[name], MD ?11/13/2021 ?10:35 AM ? ? ? ?

## 2021-11-16 ENCOUNTER — Encounter: Payer: Self-pay | Admitting: General Practice

## 2021-11-17 ENCOUNTER — Ambulatory Visit: Payer: Medicaid Other | Admitting: Family Medicine

## 2021-12-11 ENCOUNTER — Ambulatory Visit: Payer: Medicaid Other | Admitting: Family Medicine

## 2021-12-12 ENCOUNTER — Ambulatory Visit: Payer: Medicaid Other

## 2021-12-12 VITALS — BP 130/86 | HR 80 | Wt 159.0 lb

## 2021-12-12 DIAGNOSIS — Z30431 Encounter for routine checking of intrauterine contraceptive device: Secondary | ICD-10-CM | POA: Diagnosis not present

## 2021-12-12 MED ORDER — IBUPROFEN 800 MG PO TABS: 800.0000 mg | ORAL_TABLET | Freq: Three times a day (TID) | ORAL | 0 refills | Status: AC | PRN

## 2021-12-12 NOTE — Progress Notes (Signed)
    GYNECOLOGY OFFICE ENCOUNTER NOTE  History:  31 y.o. [id number] here today for today for IUD string check; Liletta  IUD was placed  Nov 13, 2021. No complaints about the IUD, but reports some brown discharge/blood and cramping.  The following portions of the patient's history were reviewed and updated as appropriate: allergies, current medications, past family history, past medical history, past social history, past surgical history and problem list. Last pap smear on October 01, 2019 was normal.  Review of Systems:  Pertinent items are noted in HPI.   Objective:  Physical Exam Blood pressure 130/86, pulse 80, weight 159 lb (72.1 kg), currently breastfeeding. CONSTITUTIONAL: Well-developed, well-nourished female in no acute distress.  NEUROLOGIC: Alert and oriented to person, place, and time. Normal reflexes, muscle tone coordination.  PSYCHIATRIC: Normal mood and affect. Normal behavior. Normal judgment and thought content. CARDIOVASCULAR: Normal heart rate noted RESPIRATORY: Effort and breath sounds normal, no problems with respiration noted ABDOMEN: Soft, no distention noted.   PELVIC: Normal appearing external genitalia; normal appearing vaginal mucosa and cervix.  IUD strings visualized, about 4 cm in length outside cervix. Strings trimmed and BME performed to tuck strings and confirm no IUD palpated in os. Done in the presence of a chaperone.   Assessment & Plan:  IUD in place Strings Trimmed  -Patient to keep IUD in place for up to eight years -Discussed usage of ibuprofen for breakthrough bleeding and cramping. Instructed to take 800mg  Q8 hrs for 5 days. Rx sent to pharmacy on file.  -Discussed yearly visits for string check.  -Can come in for removal earlier if she desires or for any concerning side effects.  -Recommended condoms for STI prevention.  MSN, CNM Advanced Practice Provider, [facility]

## 2021-12-12 NOTE — Progress Notes (Signed)
Patient complaining dark brown bleeding since insertion of IUD. Armandina Stammer RN

## 2022-05-17 ENCOUNTER — Encounter: Payer: Self-pay | Admitting: Family Medicine

## 2022-05-17 ENCOUNTER — Ambulatory Visit: Payer: Medicaid Other | Admitting: Family Medicine

## 2022-05-17 ENCOUNTER — Other Ambulatory Visit (HOSPITAL_COMMUNITY)
Admission: RE | Admit: 2022-05-17 | Discharge: 2022-05-17 | Disposition: A | Discharge status: Home / Self Care | Payer: Medicaid Other | Source: Ambulatory Visit | Attending: Family Medicine | Admitting: Family Medicine

## 2022-05-17 VITALS — BP 129/89 | HR 83 | Wt 158.0 lb

## 2022-05-17 DIAGNOSIS — N898 Other specified noninflammatory disorders of vagina: Secondary | ICD-10-CM | POA: Insufficient documentation

## 2022-05-17 NOTE — Progress Notes (Signed)
   Subjective:    Patient ID: Christina Woodard, female    DOB: Nov 30, 1990, 31 y.o.   MRN: [id number]  HPI Patient seen for vaginal pain that started yesterday and increased today.  She notices some irritation and pain on the left side of her introitus started after her period started a couple days ago.  No swelling. She did have sex the day before the pain started.   Review of Systems     Objective:   Physical Exam Vitals reviewed. Exam conducted with a chaperone present.  Constitutional:      Appearance: Normal appearance.  Abdominal:     Hernia: There is no hernia in the left inguinal area or right inguinal area.  Genitourinary:    Labia:        Right: No rash, tenderness, lesion or injury.        Left: No rash, tenderness, lesion or injury.      Urethra: No prolapse, urethral pain, urethral swelling or urethral lesion.     Vagina: No signs of injury. Tenderness (on the left side of the introitus) present. No vaginal discharge.     Lymphadenopathy:     Lower Body: No right inguinal adenopathy. No left inguinal adenopathy.  Neurological:     Mental Status: She is alert.           Assessment & Plan:  1. Vaginal irritation Culture obtained.  Ibuprofen for pain. F/u if starts to swell.

## 2022-05-21 LAB — CERVICOVAGINAL ANCILLARY ONLY
Bacterial Vaginitis (gardnerella): POSITIVE — AB
Candida Glabrata: NEGATIVE
Candida Vaginitis: NEGATIVE
Chlamydia: NEGATIVE
Comment: NEGATIVE
Comment: NEGATIVE
Comment: NEGATIVE
Comment: NEGATIVE
Comment: NEGATIVE
Comment: NORMAL
Neisseria Gonorrhea: NEGATIVE
Trichomonas: NEGATIVE

## 2022-05-21 MED ORDER — METRONIDAZOLE 500 MG PO TABS: 500.0000 mg | ORAL_TABLET | Freq: Two times a day (BID) | ORAL | 0 refills | Status: AC

## 2022-05-21 NOTE — Addendum Note (Signed)
Addended by: Levie Heritage on: 05/21/2022 06:44 PM   Modules accepted: Orders
